# Patient Record
Sex: Female | Born: 1960 | Race: Black or African American | Hispanic: No | Smoking: Current every day smoker
Health system: Southern US, Community
[De-identification: ages and names within clinical notes are randomized; demographics above are authoritative.]

## PROBLEM LIST (undated history)

## (undated) DIAGNOSIS — T7840XA Allergy, unspecified, initial encounter: Secondary | ICD-10-CM

## (undated) HISTORY — DX: Allergy, unspecified, initial encounter: T78.40XA

## (undated) HISTORY — PX: ABDOMINAL HYSTERECTOMY: SHX81

---

## 2001-01-19 ENCOUNTER — Encounter: Payer: Self-pay | Admitting: Obstetrics and Gynecology

## 2001-01-19 ENCOUNTER — Ambulatory Visit (HOSPITAL_COMMUNITY): Admission: RE | Admit: 2001-01-19 | Discharge: 2001-01-19 | Payer: Self-pay | Admitting: Obstetrics and Gynecology

## 2001-01-31 ENCOUNTER — Encounter: Payer: Self-pay | Admitting: Emergency Medicine

## 2001-01-31 ENCOUNTER — Emergency Department (HOSPITAL_COMMUNITY): Admission: EM | Admit: 2001-01-31 | Discharge: 2001-01-31 | Payer: Self-pay | Admitting: Emergency Medicine

## 2001-06-22 ENCOUNTER — Ambulatory Visit (HOSPITAL_COMMUNITY): Admission: RE | Admit: 2001-06-22 | Discharge: 2001-06-22 | Payer: Self-pay | Admitting: *Deleted

## 2001-06-22 ENCOUNTER — Encounter: Payer: Self-pay | Admitting: *Deleted

## 2002-11-06 ENCOUNTER — Ambulatory Visit (HOSPITAL_COMMUNITY): Admission: RE | Admit: 2002-11-06 | Discharge: 2002-11-06 | Payer: Self-pay | Admitting: *Deleted

## 2002-11-06 ENCOUNTER — Encounter: Payer: Self-pay | Admitting: *Deleted

## 2003-06-25 ENCOUNTER — Emergency Department (HOSPITAL_COMMUNITY): Admission: EM | Admit: 2003-06-25 | Discharge: 2003-06-25 | Payer: Self-pay | Admitting: *Deleted

## 2003-07-08 ENCOUNTER — Emergency Department (HOSPITAL_COMMUNITY): Admission: EM | Admit: 2003-07-08 | Discharge: 2003-07-08 | Payer: Self-pay | Admitting: Emergency Medicine

## 2005-06-29 ENCOUNTER — Ambulatory Visit (HOSPITAL_COMMUNITY): Admission: RE | Admit: 2005-06-29 | Discharge: 2005-06-29 | Payer: Self-pay | Admitting: Family Medicine

## 2005-11-29 ENCOUNTER — Emergency Department (HOSPITAL_COMMUNITY): Admission: EM | Admit: 2005-11-29 | Discharge: 2005-11-29 | Payer: Self-pay | Admitting: Emergency Medicine

## 2006-12-28 ENCOUNTER — Ambulatory Visit (HOSPITAL_COMMUNITY): Admission: RE | Admit: 2006-12-28 | Discharge: 2006-12-28 | Payer: Self-pay | Admitting: Internal Medicine

## 2008-04-15 ENCOUNTER — Emergency Department (HOSPITAL_COMMUNITY): Admission: EM | Admit: 2008-04-15 | Discharge: 2008-04-15 | Payer: Self-pay | Admitting: Emergency Medicine

## 2008-06-06 ENCOUNTER — Emergency Department (HOSPITAL_COMMUNITY): Admission: EM | Admit: 2008-06-06 | Discharge: 2008-06-06 | Payer: Self-pay | Admitting: Emergency Medicine

## 2009-07-13 ENCOUNTER — Ambulatory Visit (HOSPITAL_COMMUNITY): Admission: RE | Admit: 2009-07-13 | Discharge: 2009-07-13 | Payer: Self-pay | Admitting: Family Medicine

## 2009-08-20 ENCOUNTER — Other Ambulatory Visit: Admission: RE | Admit: 2009-08-20 | Discharge: 2009-08-20 | Payer: Self-pay | Admitting: Obstetrics and Gynecology

## 2009-09-08 ENCOUNTER — Encounter: Payer: Self-pay | Admitting: Obstetrics and Gynecology

## 2009-09-08 ENCOUNTER — Inpatient Hospital Stay (HOSPITAL_COMMUNITY): Admission: RE | Admit: 2009-09-08 | Discharge: 2009-09-11 | Payer: Self-pay | Admitting: Obstetrics and Gynecology

## 2010-04-26 ENCOUNTER — Emergency Department (HOSPITAL_COMMUNITY)
Admission: EM | Admit: 2010-04-26 | Discharge: 2010-04-26 | Payer: Self-pay | Source: Home / Self Care | Admitting: Emergency Medicine

## 2010-07-19 LAB — URINALYSIS, ROUTINE W REFLEX MICROSCOPIC
Glucose, UA: NEGATIVE mg/dL
Hgb urine dipstick: NEGATIVE
Protein, ur: NEGATIVE mg/dL
Urobilinogen, UA: 0.2 mg/dL (ref 0.0–1.0)

## 2010-07-19 LAB — DIFFERENTIAL
Basophils Relative: 1 % (ref 0–1)
Lymphocytes Relative: 38 % (ref 12–46)
Lymphs Abs: 2.6 10*3/uL (ref 0.7–4.0)
Monocytes Relative: 7 % (ref 3–12)
Neutrophils Relative %: 53 % (ref 43–77)

## 2010-07-19 LAB — POCT CARDIAC MARKERS
CKMB, poc: <1 ng/mL — ABNORMAL LOW (ref 1.0–8.0)
Troponin i, poc: <0.05 ng/mL (ref 0.00–0.09)

## 2010-07-19 LAB — BASIC METABOLIC PANEL
BUN: 8 mg/dL (ref 6–23)
CO2: 26 mEq/L (ref 19–32)
GFR calc non Af Amer: >60 mL/min (ref >60)
Glucose, Bld: 88 mg/dL (ref 70–99)

## 2010-07-19 LAB — CBC
MCV: 91.7 fL (ref 78.0–100.0)
Platelets: 216 10*3/uL (ref 150–400)
WBC: 6.8 10*3/uL (ref 4.0–10.5)

## 2010-07-27 LAB — CBC
HCT: 31.6 % — ABNORMAL LOW (ref 36.0–46.0)
HCT: 35.7 % — ABNORMAL LOW (ref 36.0–46.0)
Hemoglobin: 10.6 g/dL — ABNORMAL LOW (ref 12.0–15.0)
Hemoglobin: 12.3 g/dL (ref 12.0–15.0)
MCHC: 33.5 g/dL (ref 30.0–36.0)
MCHC: 34.6 g/dL (ref 30.0–36.0)
MCV: 94.7 fL (ref 78.0–100.0)
Platelets: 174 10*3/uL (ref 150–400)
Platelets: 205 10*3/uL (ref 150–400)
RBC: 3.3 MIL/uL — ABNORMAL LOW (ref 3.87–5.11)
RBC: 3.77 MIL/uL — ABNORMAL LOW (ref 3.87–5.11)
RDW: 14 % (ref 11.5–15.5)
WBC: 8.2 10*3/uL (ref 4.0–10.5)

## 2010-07-27 LAB — COMPREHENSIVE METABOLIC PANEL
ALT: 11 U/L (ref 0–35)
AST: 19 U/L (ref 0–37)
Albumin: 3.8 g/dL (ref 3.5–5.2)
Alkaline Phosphatase: 41 U/L (ref 39–117)
Chloride: 106 mEq/L (ref 96–112)
GFR calc Af Amer: >60 mL/min (ref >60)
Sodium: 138 mEq/L (ref 135–145)
Total Bilirubin: 0.8 mg/dL (ref 0.3–1.2)

## 2010-07-27 LAB — BASIC METABOLIC PANEL
Creatinine, Ser: 0.86 mg/dL (ref 0.4–1.2)
GFR calc Af Amer: >60 mL/min (ref >60)
GFR calc non Af Amer: >60 mL/min (ref >60)
Glucose, Bld: 82 mg/dL (ref 70–99)

## 2010-07-27 LAB — DIFFERENTIAL
Basophils Relative: 0 % (ref 0–1)
Eosinophils Absolute: 0.1 10*3/uL (ref 0.0–0.7)
Eosinophils Relative: 1 % (ref 0–5)
Lymphocytes Relative: 18 % (ref 12–46)
Lymphs Abs: 1.4 10*3/uL (ref 0.7–4.0)
Monocytes Absolute: 0.5 10*3/uL (ref 0.1–1.0)
Monocytes Absolute: 0.5 10*3/uL (ref 0.1–1.0)
Monocytes Relative: 6 % (ref 3–12)
Neutro Abs: 6 10*3/uL (ref 1.7–7.7)
Neutrophils Relative %: 67 % (ref 43–77)
Neutrophils Relative %: 73 % (ref 43–77)

## 2010-07-27 LAB — HCG, QUANTITATIVE, PREGNANCY: hCG, Beta Chain, Quant, S: <2 m[IU]/mL (ref <5)

## 2012-08-06 ENCOUNTER — Emergency Department (HOSPITAL_COMMUNITY)
Admission: EM | Admit: 2012-08-06 | Discharge: 2012-08-06 | Disposition: A | Discharge status: Home / Self Care | Payer: BC Managed Care – PPO | Attending: Emergency Medicine | Admitting: Emergency Medicine

## 2012-08-06 ENCOUNTER — Encounter (HOSPITAL_COMMUNITY): Payer: Self-pay | Admitting: *Deleted

## 2012-08-06 ENCOUNTER — Emergency Department (HOSPITAL_COMMUNITY): Payer: BC Managed Care – PPO

## 2012-08-06 DIAGNOSIS — F17200 Nicotine dependence, unspecified, uncomplicated: Secondary | ICD-10-CM | POA: Insufficient documentation

## 2012-08-06 DIAGNOSIS — M545 Low back pain, unspecified: Secondary | ICD-10-CM | POA: Insufficient documentation

## 2012-08-06 DIAGNOSIS — M549 Dorsalgia, unspecified: Secondary | ICD-10-CM

## 2012-08-06 LAB — BASIC METABOLIC PANEL
CO2: 25 mEq/L (ref 19–32)
Chloride: 102 mEq/L (ref 96–112)
GFR calc Af Amer: >90 mL/min (ref >90)
Potassium: 3.7 mEq/L (ref 3.5–5.1)
Sodium: 137 mEq/L (ref 135–145)

## 2012-08-06 LAB — URINALYSIS, ROUTINE W REFLEX MICROSCOPIC
Glucose, UA: NEGATIVE mg/dL
Leukocytes, UA: NEGATIVE
Nitrite: NEGATIVE
Specific Gravity, Urine: >1.03 — ABNORMAL HIGH (ref 1.005–1.030)
pH: 5.5 (ref 5.0–8.0)

## 2012-08-06 LAB — CBC WITH DIFFERENTIAL/PLATELET
Basophils Absolute: 0 10*3/uL (ref 0.0–0.1)
Basophils Relative: 0 % (ref 0–1)
Eosinophils Absolute: 0.2 10*3/uL (ref 0.0–0.7)
MCH: 30.8 pg (ref 26.0–34.0)
MCV: 92.2 fL (ref 78.0–100.0)
Monocytes Relative: 6 % (ref 3–12)
Neutro Abs: 6.3 10*3/uL (ref 1.7–7.7)
Neutrophils Relative %: 64 % (ref 43–77)
RDW: 13.4 % (ref 11.5–15.5)

## 2012-08-06 MED ORDER — CYCLOBENZAPRINE HCL 10 MG PO TABS: 10.0000 mg | ORAL_TABLET | Freq: Three times a day (TID) | ORAL | Status: DC | PRN

## 2012-08-06 MED ORDER — OXYCODONE-ACETAMINOPHEN 5-325 MG PO TABS: 1.0000 | ORAL_TABLET | ORAL | Status: DC | PRN

## 2012-08-06 MED ORDER — MORPHINE SULFATE 2 MG/ML IJ SOLN
6.0000 mg | Freq: Once | INTRAMUSCULAR | Status: AC
  Administered 2012-08-06: 6 mg via INTRAMUSCULAR
  Filled 2012-08-06: qty 3

## 2012-08-06 MED ORDER — ONDANSETRON 8 MG PO TBDP
8.0000 mg | ORAL_TABLET | Freq: Once | ORAL | Status: AC
  Administered 2012-08-06: 8 mg via ORAL
  Filled 2012-08-06: qty 1

## 2012-08-06 MED ORDER — PREDNISONE 10 MG PO TABS: ORAL_TABLET | ORAL | Status: DC

## 2012-08-06 NOTE — ED Provider Notes (Signed)
History     CSN: [id number]  Arrival date & time 08/06/12  1331   First MD Initiated Contact with Patient 08/06/12 1605      Chief Complaint  Patient presents with  . Back Pain    (Consider location/radiation/quality/duration/timing/severity/associated sxs/prior treatment) HPI Comments: Patient c/o increasing low back pain for 5 days.  Describes the pain as "sharp and aching" across her lower back.  She states that she has been coughing and having chest congestion for 2 weeks prior to onset of the back pain.  She has recently completed a course of Augmentin and zithromax.    Patient is a 52 y.o. female presenting with back pain. The history is provided by the patient.  Back Pain Location:  Lumbar spine Quality:  Aching Radiates to:  Does not radiate Pain severity:  Severe Pain is:  Same all the time Onset quality:  Gradual Duration:  5 days Timing:  Constant Progression:  Worsening Chronicity:  New Context: not falling, not jumping from heights, not lifting heavy objects, not MVA, not recent injury and not twisting   Context comment:  No known injury Relieved by:  Nothing Worsened by:  Ambulation, bending, standing, movement and twisting Ineffective treatments:  None tried Associated symptoms: no abdominal pain, no abdominal swelling, no bladder incontinence, no bowel incontinence, no chest pain, no dysuria, no fever, no leg pain, no numbness, no paresthesias, no pelvic pain, no perianal numbness, no tingling and no weakness     History reviewed. No pertinent past medical history.  Past Surgical History  Procedure Laterality Date  . Abdominal hysterectomy      History reviewed. No pertinent family history.  History  Substance Use Topics  . Smoking status: Current Every Day Smoker  . Smokeless tobacco: Not on file  . Alcohol Use: Yes    OB History   Grav Para Term Preterm Abortions TAB SAB Ect Mult Living                  Review of Systems  Constitutional:  Negative for fever.  Respiratory: Negative for shortness of breath.   Cardiovascular: Negative for chest pain.  Gastrointestinal: Negative for vomiting, abdominal pain, constipation and bowel incontinence.  Genitourinary: Negative for bladder incontinence, dysuria, hematuria, flank pain, decreased urine volume, difficulty urinating and pelvic pain.       No perineal numbness or incontinence of urine or feces  Musculoskeletal: Positive for back pain. Negative for joint swelling.  Skin: Negative for rash.  Neurological: Negative for tingling, weakness, numbness and paresthesias.  All other systems reviewed and are negative.    Allergies  Dilaudid and Hydrocodone  Home Medications   Current Outpatient Rx  Name  Route  Sig  Dispense  Refill  . ibuprofen (ADVIL,MOTRIN) 200 MG tablet   Oral   Take 400 mg by mouth every 6 (six) hours as needed for pain.         Marland Kitchen amoxicillin-clavulanate (AUGMENTIN) 875-125 MG per tablet   Oral   Take 1 tablet by mouth 2 (two) times daily.         Marland Kitchen azithromycin (ZITHROMAX) 250 MG tablet   Oral   Take 250 mg by mouth daily.           BP 127/82  Pulse 84  Temp(Src) 98.5 F (36.9 C)  Resp 20  Ht 5\' 7"  (1.702 m)  Wt 169 lb (76.658 kg)  BMI 26.46 kg/m2  SpO2 100%  Physical Exam  Nursing note and vitals  reviewed. Constitutional: She is oriented to person, place, and time. She appears well-developed and well-nourished. No distress.  HENT:  Head: Normocephalic and atraumatic.  Mouth/Throat: Oropharynx is clear and moist.  Neck: Normal range of motion. Neck supple.  Cardiovascular: Normal rate, regular rhythm, normal heart sounds and intact distal pulses.   No murmur heard. Pulmonary/Chest: Effort normal and breath sounds normal. No respiratory distress. She has no wheezes. She has no rales. She exhibits no tenderness.  Abdominal: Soft. She exhibits no distension. There is no tenderness. There is no rebound and no guarding.    Musculoskeletal: She exhibits tenderness. She exhibits no edema.       Lumbar back: She exhibits tenderness and pain. She exhibits normal range of motion, no swelling, no deformity, no laceration and normal pulse.       Back:  ttp of the lumbar spine and paraspinal muscles.  DP pulses brisk and symmetrical, distal sensation intact  Neurological: She is alert and oriented to person, place, and time. No cranial nerve deficit or sensory deficit. She exhibits normal muscle tone. Coordination and gait normal.  Reflex Scores:      Patellar reflexes are 2+ on the right side and 2+ on the left side.      Achilles reflexes are 2+ on the right side and 2+ on the left side. Skin: Skin is warm and dry.    ED Course  Procedures (including critical care time)  Results for orders placed during the hospital encounter of 08/06/12  CBC WITH DIFFERENTIAL      Result Value Range   WBC 9.9  4.0 - 10.5 K/uL   RBC 4.09  3.87 - 5.11 MIL/uL   Hemoglobin 12.6  12.0 - 15.0 g/dL   HCT 16.1  09.6 - 04.5 %   MCV 92.2  78.0 - 100.0 fL   MCH 30.8  26.0 - 34.0 pg   MCHC 33.4  30.0 - 36.0 g/dL   RDW 40.9  81.1 - 91.4 %   Platelets 229  150 - 400 K/uL   Neutrophils Relative 64  43 - 77 %   Neutro Abs 6.3  1.7 - 7.7 K/uL   Lymphocytes Relative 29  12 - 46 %   Lymphs Abs 2.9  0.7 - 4.0 K/uL   Monocytes Relative 6  3 - 12 %   Monocytes Absolute 0.6  0.1 - 1.0 K/uL   Eosinophils Relative 2  0 - 5 %   Eosinophils Absolute 0.2  0.0 - 0.7 K/uL   Basophils Relative 0  0 - 1 %   Basophils Absolute 0.0  0.0 - 0.1 K/uL  BASIC METABOLIC PANEL      Result Value Range   Sodium 137  135 - 145 mEq/L   Potassium 3.7  3.5 - 5.1 mEq/L   Chloride 102  96 - 112 mEq/L   CO2 25  19 - 32 mEq/L   Glucose, Bld 80  70 - 99 mg/dL   BUN 11  6 - 23 mg/dL   Creatinine, Ser 7.82  0.50 - 1.10 mg/dL   Calcium 9.2  8.4 - 95.6 mg/dL   GFR calc non Af Amer >90  >90 mL/min   GFR calc Af Amer >90  >90 mL/min  URINALYSIS, ROUTINE W REFLEX  MICROSCOPIC      Result Value Range   Color, Urine YELLOW  YELLOW   APPearance CLEAR  CLEAR   Specific Gravity, Urine >1.030 (*) 1.005 - 1.030   pH 5.5  5.0 - 8.0   Glucose, UA NEGATIVE  NEGATIVE mg/dL   Hgb urine dipstick NEGATIVE  NEGATIVE   Bilirubin Urine NEGATIVE  NEGATIVE   Ketones, ur NEGATIVE  NEGATIVE mg/dL   Protein, ur NEGATIVE  NEGATIVE mg/dL   Urobilinogen, UA 0.2  0.0 - 1.0 mg/dL   Nitrite NEGATIVE  NEGATIVE   Leukocytes, UA NEGATIVE  NEGATIVE     Dg Lumbar Spine Complete  08/06/2012  *RADIOLOGY REPORT*  Clinical Data: Back pain  LUMBAR SPINE - COMPLETE 4+ VIEW  Comparison: None.  Findings: Negative for fracture.  Normal alignment.  No pars defect.  SI joints are intact.  IMPRESSION: Negative   Original Report Authenticated By: Janeece Riggers, M.D.       MDM   Patient has ttp of the lumbar spine and paraspinal muscles.  No focal neuro deficits on exam. Discussed results with patient.   Ambulates with a slow, but steady gait.   No fever, doubt emergent infectious or neurological process.    She is feeling better after IM Morphine and ODT zofran.  She agrees to close f/u with her PMD.  I have also advised her to return here if her sx's worsen.  The patient appears reasonably screened and/or stabilized for discharge and I doubt any other medical condition or other [facility] requiring further screening, evaluation, or treatment in the ED at this time prior to discharge.       Kathlene Yano L. Trisha Mangle, PA-C 08/09/12 1347

## 2012-08-06 NOTE — ED Notes (Signed)
Pa triplett to see pt.

## 2012-08-06 NOTE — ED Notes (Signed)
Low back pain since Wednesday, No injury,INcreased pain with movement Recent URI , treated at [facility] Medicine.  Cough,green, brown sputum.  No NVD.  No fever.

## 2012-08-06 NOTE — ED Notes (Signed)
Pt stated she has been sick for several weeks w/ sinus infection and cough. She has been on antibiotics and "do not seem to be getting any better", she has been having severe low back pain for the last 5 days, denies any known injury to her back. Denies any fever or urinary s/s.  Tearful in exam room and stated she has been unable to work. Which is un-like her.

## 2012-08-06 NOTE — ED Notes (Signed)
Pt sitting up in chair, stated she is feeling much better, sitting up in chair currently. Friend at bedside.  Pt was given ginger ale and crackers. Tolerated well.

## 2012-08-09 NOTE — ED Provider Notes (Signed)
Medical screening examination/treatment/procedure(s) were performed by non-physician practitioner and as supervising physician I was immediately available for consultation/collaboration.   Laray Anger, DO 08/09/12 2040

## 2013-03-11 ENCOUNTER — Emergency Department (HOSPITAL_COMMUNITY): Payer: BC Managed Care – PPO

## 2013-03-11 ENCOUNTER — Emergency Department (HOSPITAL_COMMUNITY)
Admission: EM | Admit: 2013-03-11 | Discharge: 2013-03-11 | Disposition: A | Discharge status: Home / Self Care | Payer: BC Managed Care – PPO | Attending: Emergency Medicine | Admitting: Emergency Medicine

## 2013-03-11 ENCOUNTER — Encounter (HOSPITAL_COMMUNITY): Payer: Self-pay | Admitting: Emergency Medicine

## 2013-03-11 DIAGNOSIS — R1031 Right lower quadrant pain: Secondary | ICD-10-CM | POA: Insufficient documentation

## 2013-03-11 DIAGNOSIS — Z792 Long term (current) use of antibiotics: Secondary | ICD-10-CM | POA: Insufficient documentation

## 2013-03-11 DIAGNOSIS — R1909 Other intra-abdominal and pelvic swelling, mass and lump: Secondary | ICD-10-CM | POA: Insufficient documentation

## 2013-03-11 DIAGNOSIS — F17200 Nicotine dependence, unspecified, uncomplicated: Secondary | ICD-10-CM | POA: Insufficient documentation

## 2013-03-11 DIAGNOSIS — R1900 Intra-abdominal and pelvic swelling, mass and lump, unspecified site: Secondary | ICD-10-CM

## 2013-03-11 DIAGNOSIS — Z79899 Other long term (current) drug therapy: Secondary | ICD-10-CM | POA: Insufficient documentation

## 2013-03-11 LAB — URINE MICROSCOPIC-ADD ON

## 2013-03-11 LAB — CBC WITH DIFFERENTIAL/PLATELET
Basophils Absolute: 0 10*3/uL (ref 0.0–0.1)
Basophils Relative: 0 % (ref 0–1)
HCT: 37.5 % (ref 36.0–46.0)
Hemoglobin: 12.7 g/dL (ref 12.0–15.0)
Lymphocytes Relative: 13 % (ref 12–46)
Monocytes Absolute: 0.7 10*3/uL (ref 0.1–1.0)
Monocytes Relative: 6 % (ref 3–12)
Neutro Abs: 8.6 10*3/uL — ABNORMAL HIGH (ref 1.7–7.7)
Neutrophils Relative %: 80 % — ABNORMAL HIGH (ref 43–77)
WBC: 10.8 10*3/uL — ABNORMAL HIGH (ref 4.0–10.5)

## 2013-03-11 LAB — BASIC METABOLIC PANEL
BUN: 10 mg/dL (ref 6–23)
CO2: 25 mEq/L (ref 19–32)
Chloride: 98 mEq/L (ref 96–112)
Creatinine, Ser: 0.87 mg/dL (ref 0.50–1.10)
GFR calc Af Amer: 89 mL/min — ABNORMAL LOW (ref >90)
Potassium: 3.5 mEq/L (ref 3.5–5.1)

## 2013-03-11 LAB — URINALYSIS, ROUTINE W REFLEX MICROSCOPIC
Glucose, UA: NEGATIVE mg/dL
Ketones, ur: 40 mg/dL — AB
Leukocytes, UA: NEGATIVE
Nitrite: NEGATIVE
Specific Gravity, Urine: >1.03 — ABNORMAL HIGH (ref 1.005–1.030)
pH: 6 (ref 5.0–8.0)

## 2013-03-11 MED ORDER — IOHEXOL 300 MG/ML  SOLN
50.0000 mL | Freq: Once | INTRAMUSCULAR | Status: AC | PRN
  Administered 2013-03-11: 50 mL via ORAL

## 2013-03-11 MED ORDER — POLYETHYLENE GLYCOL 3350 17 G PO PACK: 17.0000 g | PACK | Freq: Every day | ORAL | Status: DC

## 2013-03-11 MED ORDER — OXYCODONE-ACETAMINOPHEN 5-325 MG PO TABS
1.0000 | ORAL_TABLET | ORAL | Status: DC | PRN
Start: 2013-03-11 — End: 2015-11-09

## 2013-03-11 MED ORDER — MORPHINE SULFATE 4 MG/ML IJ SOLN
4.0000 mg | Freq: Once | INTRAMUSCULAR | Status: AC
  Administered 2013-03-11: 4 mg via INTRAVENOUS
  Filled 2013-03-11: qty 1

## 2013-03-11 MED ORDER — IOHEXOL 300 MG/ML  SOLN
100.0000 mL | Freq: Once | INTRAMUSCULAR | Status: AC | PRN
  Administered 2013-03-11: 100 mL via INTRAVENOUS

## 2013-03-11 MED ORDER — ONDANSETRON HCL 4 MG/2ML IJ SOLN
4.0000 mg | Freq: Once | INTRAMUSCULAR | Status: AC
  Administered 2013-03-11: 4 mg via INTRAVENOUS
  Filled 2013-03-11: qty 2

## 2013-03-11 NOTE — ED Provider Notes (Signed)
CSN: [id number]     Arrival date & time 03/11/13  1338 History   First MD Initiated Contact with Patient 03/11/13 1351     Chief Complaint  Patient presents with  . Flank Pain   (Consider location/radiation/quality/duration/timing/severity/associated sxs/prior Treatment) HPI Comments: Patient presents to the ER with complaints of right lower abdominal and pelvic pain for 3 days. Patient reports that the pain has been constant and severe since it started. She has not identified any factors that make it better or worse. She has not had any fever, nausea, vomiting or diarrhea. Patient says that at times it is a crampy, like when she has had severe cast in the past. He does not feel like when she had a kidney stone previously. She has not had any urinary symptoms.  Patient is a 52 y.o. female presenting with flank pain.  Flank Pain Associated symptoms include abdominal pain.    History reviewed. No pertinent past medical history. Past Surgical History  Procedure Laterality Date  . Abdominal hysterectomy     No family history on file. History  Substance Use Topics  . Smoking status: Current Every Day Smoker  . Smokeless tobacco: Not on file  . Alcohol Use: Yes   OB History   Grav Para Term Preterm Abortions TAB SAB Ect Mult Living                 Review of Systems  Gastrointestinal: Positive for abdominal pain.  Genitourinary: Positive for pelvic pain. Negative for dysuria, frequency and hematuria.  All other systems reviewed and are negative.    Allergies  Dilaudid and Hydrocodone  Home Medications   Current Outpatient Rx  Name  Route  Sig  Dispense  Refill  . amoxicillin-clavulanate (AUGMENTIN) 875-125 MG per tablet   Oral   Take 1 tablet by mouth 2 (two) times daily.         Marland Kitchen azithromycin (ZITHROMAX) 250 MG tablet   Oral   Take 250 mg by mouth daily.         . cyclobenzaprine (FLEXERIL) 10 MG tablet   Oral   Take 1 tablet (10 mg total) by mouth 3 (three)  times daily as needed.   21 tablet   0   . ibuprofen (ADVIL,MOTRIN) 200 MG tablet   Oral   Take 400 mg by mouth every 6 (six) hours as needed for pain.         Marland Kitchen oxyCODONE-acetaminophen (PERCOCET/ROXICET) 5-325 MG per tablet   Oral   Take 1 tablet by mouth every 4 (four) hours as needed for pain.   20 tablet   0   . predniSONE (DELTASONE) 10 MG tablet      Take 6 tablets day one, 5 tablets day two, 4 tablets day three, 3 tablets day four, 2 tablets day five, then 1 tablet day six   21 tablet   0    BP 127/85  Pulse 93  Temp(Src) 98.8 F (37.1 C) (Oral)  Resp 22  Ht 5\' 7"  (1.702 m)  Wt 161 lb (73.029 kg)  BMI 25.21 kg/m2  SpO2 100% Physical Exam  Constitutional: She is oriented to person, place, and time. She appears well-developed and well-nourished. No distress.  HENT:  Head: Normocephalic and atraumatic.  Right Ear: Hearing normal.  Left Ear: Hearing normal.  Nose: Nose normal.  Mouth/Throat: Oropharynx is clear and moist and mucous membranes are normal.  Eyes: Conjunctivae and EOM are normal. Pupils are equal, round, and reactive to  light.  Neck: Normal range of motion. Neck supple.  Cardiovascular: Regular rhythm, S1 normal and S2 normal.  Exam reveals no gallop and no friction rub.   No murmur heard. Pulmonary/Chest: Effort normal and breath sounds normal. No respiratory distress. She exhibits no tenderness.  Abdominal: Soft. Normal appearance and bowel sounds are normal. There is no hepatosplenomegaly. There is tenderness in the right lower quadrant. There is no rebound, no guarding, no tenderness at McBurney's point and negative Murphy's sign. No hernia.  Musculoskeletal: Normal range of motion.  Neurological: She is alert and oriented to person, place, and time. She has normal strength. No cranial nerve deficit or sensory deficit. Coordination normal. GCS eye subscore is 4. GCS verbal subscore is 5. GCS motor subscore is 6.  Skin: Skin is warm, dry and intact.  No rash noted. No cyanosis.  Psychiatric: She has a normal mood and affect. Her speech is normal and behavior is normal. Thought content normal.    ED Course  Procedures (including critical care time) Labs Review Labs Reviewed  CBC WITH DIFFERENTIAL - Abnormal; Notable for the following:    WBC 10.8 (*)    Neutrophils Relative % 80 (*)    Neutro Abs 8.6 (*)    All other components within normal limits  BASIC METABOLIC PANEL - Abnormal; Notable for the following:    GFR calc non Af Amer 76 (*)    GFR calc Af Amer 89 (*)    All other components within normal limits  URINALYSIS, ROUTINE W REFLEX MICROSCOPIC - Abnormal; Notable for the following:    Specific Gravity, Urine >1.030 (*)    Hgb urine dipstick TRACE (*)    Bilirubin Urine SMALL (*)    Ketones, ur 40 (*)    All other components within normal limits  URINE MICROSCOPIC-ADD ON   Imaging Review Ct Abdomen Pelvis W Contrast  03/11/2013   CLINICAL DATA:  Initial encounter for increasing low back pain, right lower quadrant abdominal pain, and pelvic pain over the past 5 days. Patient recently completed a course of antibiotic therapy including Augmentin and Zithromax. Surgical history includes abdominal hysterectomy (supracervical hysterectomy, right salpingectomy, and excision of abdominal wall lipoma) per operative report 09/08/2009.  EXAM: CT ABDOMEN AND PELVIS WITH CONTRAST  TECHNIQUE: Multidetector CT imaging of the abdomen and pelvis was performed using the standard protocol following bolus administration of intravenous contrast.  CONTRAST:  OMNIPAQUE IOHEXOL 300 MG/ML IV. Oral contrast also administered.  COMPARISON:  06/22/2001.  FINDINGS: Soft tissue mass involving the midline low pelvis just above the vaginal fornices measuring approximately 4.0 x 7.8 x 3.3 cm (series 2, image 66 and coronal image 56). Heterogeneous masses in both sides of the mid pelvis, that on the right measuring approximately 6.8 x 4.6 x 5.2 cm (series  2, image 59 and coronal image 50), and that on the left measuring approximately 7.2 x 3.5 x 6.9 cm (series 2, image 59 coronal image 47). The mid portion of the sigmoid colon is compressed between these 2 masses, though the colon is not distended to suggest obstruction; however, there is a large stool burden throughout the colon.  Upper normal size liver with a prominent Reidel lobe, but no focal hepatic parenchymal abnormality. Normal appearing spleen, pancreas, adrenal glands, left kidney, and gallbladder. 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 Normal appearing gallbladder with no evidence of biliary ductal dilation. Mild distal abdominal aortic and bilateral common iliac artery  atherosclerosis without aneurysm.  Numerous upper normal sized lymph nodes throughout the retroperitoneum, the largest a left common iliac node measuring approximately 1.1 x 1.3 cm (series 2, image 40). Enlarged gastrohepatic ligament lymph node approximating 2.0 x 1.5 cm (series 2, image 16).  Normal appearing stomach and small bowel. Cecum extends into the low pelvis, and a normal appearing appendix is identified anteriorly in the right low pelvis. No ascites.  Urinary bladder decompressed and unremarkable. Numerous very large pelvic phleboliths, with prominent pelvic veins bilaterally.  Bone window images unremarkable apart from mild facet degenerative changes on the right at L4-5 and L5-S1, and broad-based central disc protrusion at L4-5. Visualized lung bases clear. Heart size normal.  IMPRESSION: 1. Soft tissue mass in the midline low pelvis just above the vaginal fornices, measured above. With prior history of supracervical hysterectomy, cancer involving the cervix should be excluded. 2. Heterogeneous masses in both sides of the mid pelvis, measured above. 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  3. Numerous upper normal sized lymph nodes throughout the retroperitoneum and a borderline enlarged lymph node in the gastrohepatic ligament. No nodal masses elsewhere. 4. No evidence of metastatic disease elsewhere in the abdomen or pelvis. 5. Mid sigmoid colon narrowed between the 2 pelvic masses, but no evidence of colonic obstruction currently. Large stool burden throughout the colon.   Electronically Signed   By: Hulan Saas M.D.   On: 03/11/2013 15:17    EKG Interpretation   None       MDM  Diagnosis: Pelvic Mass; Abdominal and Pelvic Pain  Patient presents to the ER with complaints of pain in the low abdomen and pelvis region with radiation over to the right side. Symptoms present for several days. She reports that initially it felt like gas, but symptoms have not worsened in pain is continuous. Examination of the is tenderness in the suprapubic and lower abdominal area without focality. No signs of peritonitis. Gross moderately unremarkable. CAT scan was performed to further evaluate, including appendix. Patient has soft tissue mass in the midline which is concerning for possible cervical cancer. Additionally she has bilateral masses in the ovaries which could be metastasis.  These findings were discussed with the patient. I did inform her that there was a mass that could in fact be cancer. Her initial hysterectomy was performed by Doctor Emelda Fear. She will be referred back to Doctor Emelda Fear for further evaluation. Patient will be prescribed analgesia. She does have evidence of constipation and increased Berner CAT scan, will be prescribed laxative in addition to analgesia. Patient expresses understanding of the findings of the CAT scan and will followup with Doctor Emelda Fear.    Gilda Crease, MD 03/11/13 [phone]

## 2013-03-11 NOTE — ED Notes (Signed)
Complain of pain in right side since Friday. States she was nauseated on Friday

## 2013-03-13 ENCOUNTER — Encounter: Payer: Self-pay | Admitting: Obstetrics and Gynecology

## 2013-03-13 ENCOUNTER — Telehealth: Payer: Self-pay | Admitting: Obstetrics and Gynecology

## 2013-03-13 DIAGNOSIS — R1900 Intra-abdominal and pelvic swelling, mass and lump, unspecified site: Secondary | ICD-10-CM | POA: Insufficient documentation

## 2013-03-13 NOTE — Telephone Encounter (Signed)
ED visit 11/3 indicated "pelvic mass" in area of residual cervix after supracervical hysterectomy.  Left msg for pt to call office for followup appt.

## 2013-03-14 NOTE — Telephone Encounter (Signed)
Left message to call and schedule appt per Dr Emelda Fear request.

## 2013-03-18 ENCOUNTER — Ambulatory Visit (INDEPENDENT_AMBULATORY_CARE_PROVIDER_SITE_OTHER): Payer: BC Managed Care – PPO | Admitting: Adult Health

## 2013-03-18 ENCOUNTER — Other Ambulatory Visit (HOSPITAL_COMMUNITY)
Admission: RE | Admit: 2013-03-18 | Discharge: 2013-03-18 | Disposition: A | Discharge status: Home / Self Care | Payer: BC Managed Care – PPO | Source: Ambulatory Visit | Attending: Adult Health | Admitting: Adult Health

## 2013-03-18 ENCOUNTER — Encounter: Payer: Self-pay | Admitting: Adult Health

## 2013-03-18 VITALS — BP 120/80 | Ht 67.0 in | Wt 167.0 lb

## 2013-03-18 DIAGNOSIS — Z124 Encounter for screening for malignant neoplasm of cervix: Secondary | ICD-10-CM

## 2013-03-18 DIAGNOSIS — Z1151 Encounter for screening for human papillomavirus (HPV): Secondary | ICD-10-CM | POA: Insufficient documentation

## 2013-03-18 DIAGNOSIS — R1900 Intra-abdominal and pelvic swelling, mass and lump, unspecified site: Secondary | ICD-10-CM

## 2013-03-18 DIAGNOSIS — Z1212 Encounter for screening for malignant neoplasm of rectum: Secondary | ICD-10-CM

## 2013-03-18 DIAGNOSIS — Z01419 Encounter for gynecological examination (general) (routine) without abnormal findings: Secondary | ICD-10-CM | POA: Insufficient documentation

## 2013-03-18 DIAGNOSIS — Z113 Encounter for screening for infections with a predominantly sexual mode of transmission: Secondary | ICD-10-CM | POA: Insufficient documentation

## 2013-03-18 DIAGNOSIS — N949 Unspecified condition associated with female genital organs and menstrual cycle: Secondary | ICD-10-CM

## 2013-03-18 LAB — HEMOCCULT GUIAC POC 1CARD (OFFICE): Fecal Occult Blood, POC: NEGATIVE

## 2013-03-18 NOTE — Assessment & Plan Note (Signed)
Pap performed 11/10 with HPV and GC/CHL and will get CA 125 and pelvic US

## 2013-03-18 NOTE — Progress Notes (Addendum)
Subjective:     Patient ID: Kristen Lang, female   DOB: 08-Jun-1960, 52 y.o.   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  Review of Systems Positives in HPI Reviewed past medical,surgical, social and family history. Reviewed medications and allergies. Past Medical History  Diagnosis Date  . Allergy   Current outpatient prescriptions:oxyCODONE-acetaminophen (PERCOCET) 5-325 MG per tablet, Take 1-2 tablets by mouth every 4 (four) hours as needed for pain., Disp: 20 tablet, Rfl: 0;  polyethylene glycol (MIRALAX / GLYCOLAX) packet, Take 17 g by mouth daily., Disp: 5 each, Rfl: 0    Past Surgical History  Procedure Laterality Date  . Abdominal hysterectomy     Objective:   Physical Exam BP 120/80  Ht 5\' 7"  (1.702 m)  Wt 167 lb (75.751 kg)  BMI 26.15 kg/m2   Skin warm and dry. Neck: mid line trachea, normal thyroid. Lungs: clear to ausculation bilaterally. Cardiovascular: regular rate and rhythm.Abdomen soft, no HSM noted.Pelvic: external genitalia is normal in appearance, vagina: white discharge without odor, cervix:smooth and bulbous,Pap with HPV and GC/CHL performed. uterus: is absent,  Adnexa:+  Mass RLQ and  tenderness noted through out pelvis esp RLQ. On rectal exam no polyps or hemorrhoids and hemoccult negative. We discussed it could be a cancer or benign mass, that is the reason for the tests today  Assessment:    Pelvic pain  Pelvic mass with +lymph nodes    Plan:     [place] scheduled 11/11 at 3 pm at [facility] Check CA 125 Return in 2 days with Dr Emelda Fear to discuss results Note given to return  to work 03/25/13 Review handout on pelvic pain

## 2013-03-18 NOTE — Patient Instructions (Addendum)
Pelvic Mass A "mass" is a lump that either your caregiver found during an examination or you found before seeing your caregiver. The "pelvis" is the lower portion of the trunk in between the hip bones. There are many possible reasons why a lump has appeared. Testing will help determine the cause and the steps to a solution. CAUSES  Before complete testing is done, it may be difficult or impossible for your caregiver to know if the lump is truly in one of the pelvic organs (such as the uterus or ovaries) or is coming from one of many organs that are near the pelvis. Problems in the colon or kidney can also lead to a lump that might seem to be in the pelvis. If testing shows that the mass is in the pelvis, there are still many possible causes:  Tumors and cancers. These problems are relatively common and are the greatest source of worry for patients. Cancerous lumps in the pelvis may be due to cancers that started in the uterus or ovaries or due to cancers that started in other areas and then spread to the pelvis. Many cancers are very treatable when found early.  Non-cancerous tumors and masses. There are a large number of common and uncommon non-cancerous problems that can lead to a mass in the pelvis. Two very common ones are fibroids of the uterus and ovarian cysts. Before testing and/or surgery, it may be impossible to tell the difference between these problems and a cancer.  Infection. Certain types of infections can produce a mass in the pelvis. The infection might be caused by bacteria. If there is an infection treatment might include antibiotics. Masses from infection can also be caused by certain viruses, and in rare cases, by fungi or parasites. If infection is the cause, your caregiver will be able to determine the type of germ responsible for the mass by doing appropriate testing.  Inflammatory bowel disease. These are diseases thought to be caused by a defect in the immune system of the  intestine. There are two inflammatory bowel diseases: Ulcerative Colitis and Crohn's Disease. They are lifelong problems with symptoms that can come and go. Sometimes, patients with these diseases will develop a mass in the lower part of the colon that can make it seem as though there is a mass in the pelvis.  Past Surgery. If there has been pelvic surgery in the past, and there is a lot of scarring that forms during the process of healing, this can eventually fell like a mass when examined by your caregiver. As with the other problems described above, this may or may not be associated with symptoms or feeling badly.  Ectopic Pregnancy. This is a condition where the growing fetus is growing outside of the uterus. This is a common cause for a pelvic mass and may become a serious or life-threatening problem that requires immediate surgery. SYMPTOMS  In people with a pelvic mass, there may be a large variety of associated symptoms including:   No symptoms, other than the appearance of the mass itself.  Cramping, nausea, diarrhea.  Fever, vomiting, weakness.  Pelvic, side, and/or back pain.  Weight loss.  Constipation.  Problems with vaginal bleeding. This can be very variable. Bleeding might be very light or very heavy. Bleeding may be mixed with large clots. Menstruation may be very frequent and may seem to almost completely stop. There may be varying levels of pain with menstruation.  Urinary symptoms including frequent urination, inability to empty the  bladder completely, or urinating very small amounts. DIAGNOSIS  Because of the large number of causes of a mass in the pelvis, your caregiver will ask you to undergo testing in order to get a clear diagnosis in a timely manner. The tests might include some or all of the following:  Blood tests such as a blood count, measurement of common minerals in the blood, kidney/liver/pancreas function, pregnancy test, and others.  X-rays. Plain x-rays  and special x-rays may be requested except if you are pregnant.  Ultrasound. This is a test that uses sound waves to "paint a picture" of the mass. The type of "sound picture" that is seen can help to narrow the diagnosis.  CAT scan and MRI imaging. Each can provide additional information as to the different characteristics of the mass and can help to develop a final plan for diagnostics and treatment. If cancer is suspected, these special tests can also help to show any spread of the cancer to other parts of the body. It is possible that these tests may not be ordered if you are pregnant.  Laparoscopy. This is a special exam of the inside of the pelvic area using a slim, flexible, lighted tube. This allows your caregiver to get a direct look at the mass. Sometimes, this allows getting a very small piece of the mass (a biopsy). This piece of tissue can then be examined in a lab that will frequently lead to a clear diagnosis. In some cases, your caregiver can use a laparoscope to completely remove the mass after it has been examined.  Surgery. Sometimes, a diagnosis can only be made by carrying out an operation and obtaining a biopsy (as noted above). Many times, the biopsy is obtained and the mass is removed during the same operation. These are the most common ways for determining the exact cause of the mass. Your caregiver may recommend other tests that are not listed here. TREATMENT  Treatment(s) can only be recommended after a diagnosis is made. Your caregiver will discuss your test results with you, the meanings of the tests, and the recommended steps to begin treatment. He/she will also recommend whether you need to be examined by specialists as you go through the steps of diagnosis and a treatment plan is developed. HOME CARE INSTRUCTIONS   Test preparation. Carefully follow instructions when preparing for certain tests. This may involve many things such as:  Drinking fluids to fill the bladder  before a pelvic ultrasound.  Fasting before certain blood tests.  Drinking special "contrast" fluids that are necessary for obtaining the best CAT scan and MRI images.  Medications. Your caregiver may prescribe medications to help relieve symptoms while you undergo testing. It is important that your current medications (prescription, non-prescription, herbal, vitamins, etc.) be kept in mind when new prescriptions are recommended.  Diet. There may be a need for changes in diet in order to help with symptom relief while testing is being done. If this applies to you, your caregiver will discuss these changes with you. SEEK MEDICAL CARE IF:   You cannot hold down any of the recommended fluids used to prepare for tests such as CAT scan MRI.  You feel that you are having trouble with any new prescriptions.  You develop new symptoms of pain, vomiting, diarrhea, fever, or other problems that you did not feel since your last exam.  You experience inability to empty your bladder completely or develop painful and/or bloody urination. SEEK IMMEDIATE MEDICAL CARE IF:  You vomit bright red blood, or a coffee ground appearing material.  You have blood in your stools, or the stools turn black and tarry.  You have an abnormal or increased amount of vaginal bleeding.  You have a fever.  You develop easy bruising or bleeding.  You develop pain that is not controlled by your medication.  You feel worsening weakness or you have a fainting episode.  You feel that the mass has suddenly gotten larger.  You develop severe bloating in the abdomen and/or pelvis.  You cannot pass any urine. MAKE SURE YOU:   Understand these instructions.  Will watch your condition.  Will get help right away if you are not doing well or get worse. Document Released: 08/02/2006 Document Revised: 07/18/2011 Document Reviewed: 04/10/2007 [facility] Patient Information 2014 [place], [place]. Get [place] at [facility] 11/11 at 3 pm  with full bladder Return here in 2 days to see Dr Rachel Bo

## 2013-03-19 ENCOUNTER — Ambulatory Visit (HOSPITAL_COMMUNITY): Payer: BC Managed Care – PPO

## 2013-03-19 ENCOUNTER — Ambulatory Visit (HOSPITAL_COMMUNITY)
Admission: RE | Admit: 2013-03-19 | Discharge: 2013-03-19 | Disposition: A | Discharge status: Home / Self Care | Payer: BC Managed Care – PPO | Source: Ambulatory Visit | Attending: Adult Health | Admitting: Adult Health

## 2013-03-19 DIAGNOSIS — N949 Unspecified condition associated with female genital organs and menstrual cycle: Secondary | ICD-10-CM | POA: Insufficient documentation

## 2013-03-19 DIAGNOSIS — R1900 Intra-abdominal and pelvic swelling, mass and lump, unspecified site: Secondary | ICD-10-CM | POA: Insufficient documentation

## 2013-03-19 LAB — CA 125: CA 125: 6 U/mL (ref 0.0–30.2)

## 2013-03-20 ENCOUNTER — Ambulatory Visit (INDEPENDENT_AMBULATORY_CARE_PROVIDER_SITE_OTHER): Payer: BC Managed Care – PPO | Admitting: Obstetrics and Gynecology

## 2013-03-20 ENCOUNTER — Encounter: Payer: Self-pay | Admitting: Obstetrics and Gynecology

## 2013-03-20 VITALS — BP 140/86 | Ht 67.0 in | Wt 161.0 lb

## 2013-03-20 DIAGNOSIS — N83201 Unspecified ovarian cyst, right side: Secondary | ICD-10-CM

## 2013-03-20 DIAGNOSIS — N83209 Unspecified ovarian cyst, unspecified side: Secondary | ICD-10-CM

## 2013-03-20 NOTE — Patient Instructions (Signed)
Recheck in 3 month will complete eval.

## 2013-03-20 NOTE — Progress Notes (Signed)
   [facility] Visit  Patient name: Kristen Lang MRN [id number]  Date of birth: 1960/06/08  Patient ID: [name], female   DOB: 11-Feb-1961, 52 y.o.   MRN: [id number] Pt here today to follow up from last visit. Pt states that during the [place] yesterday that she was told she had a lot of stool and states that she is taking the miralax and goes everyday, but is concerned. Pt was seen Monday by Victorino Dike, labs were drawn and pelvic exam done.  Ca 125 drawn, results NORMAL at 6.6    ROS:  Bowel function normally daily,, tho u/s shows a lot of stool. denies ovary pain, or dyspareunia.  Pertinent History Reviewed:  Medical & Surgical Hx:  Reviewed: Significant for supracervical hyst Medications: Reviewed & Updated - see associated section Social History: Reviewed -  reports that she has been smoking Cigarettes.  She has a 2.25 pack-year smoking history. She has never used smokeless tobacco.  Objective Findings:  Vitals: BP 140/86  Ht 5\' 7"  (1.702 m)  Wt 161 lb (73.029 kg)  BMI 25.21 kg/m2  Physical Examination: General appearance - alert, well appearing, and in no distress, oriented to person, place, and time and normal appearing weight Mental status - alert, oriented to person, place, and time, normal mood, behavior, speech, dress, motor activity, and thought processes   Assessment & Plan:   Bilateral ovarian cysts, benign with normal ca125  Reassess 3 months with ca125 and u/s

## 2013-03-21 ENCOUNTER — Telehealth: Payer: Self-pay | Admitting: Adult Health

## 2013-03-21 NOTE — Telephone Encounter (Signed)
Left message that pap normal.

## 2013-06-18 ENCOUNTER — Other Ambulatory Visit: Payer: Self-pay | Admitting: Obstetrics and Gynecology

## 2013-06-18 DIAGNOSIS — R1900 Intra-abdominal and pelvic swelling, mass and lump, unspecified site: Secondary | ICD-10-CM

## 2013-06-20 ENCOUNTER — Other Ambulatory Visit: Payer: BC Managed Care – PPO

## 2013-06-20 ENCOUNTER — Ambulatory Visit: Payer: BC Managed Care – PPO | Admitting: Obstetrics and Gynecology

## 2014-03-10 ENCOUNTER — Encounter: Payer: Self-pay | Admitting: Obstetrics and Gynecology

## 2015-04-25 ENCOUNTER — Ambulatory Visit (INDEPENDENT_AMBULATORY_CARE_PROVIDER_SITE_OTHER): Payer: BC Managed Care – PPO

## 2015-04-25 ENCOUNTER — Ambulatory Visit (INDEPENDENT_AMBULATORY_CARE_PROVIDER_SITE_OTHER): Payer: BC Managed Care – PPO | Admitting: Family Medicine

## 2015-04-25 VITALS — BP 122/72 | HR 75 | Temp 98.1°F | Resp 18 | Ht 67.0 in | Wt 173.0 lb

## 2015-04-25 DIAGNOSIS — M792 Neuralgia and neuritis, unspecified: Secondary | ICD-10-CM | POA: Diagnosis not present

## 2015-04-25 DIAGNOSIS — R0789 Other chest pain: Secondary | ICD-10-CM

## 2015-04-25 DIAGNOSIS — R05 Cough: Secondary | ICD-10-CM

## 2015-04-25 DIAGNOSIS — R059 Cough, unspecified: Secondary | ICD-10-CM

## 2015-04-25 DIAGNOSIS — R058 Other specified cough: Secondary | ICD-10-CM

## 2015-04-25 LAB — POCT CBC
GRANULOCYTE PERCENT: 56.9 % (ref 37–80)
HCT, POC: 38.2 % (ref 37.7–47.9)
Hemoglobin: 12.8 g/dL (ref 12.2–16.2)
LYMPH, POC: 2.7 (ref 0.6–3.4)
MCH, POC: 30.9 pg (ref 27–31.2)
MCHC: 33.5 g/dL (ref 31.8–35.4)
MCV: 92.2 fL (ref 80–97)
MID (CBC): 0.3 (ref 0–0.9)
MPV: 6.6 fL (ref 0–99.8)
PLATELET COUNT, POC: 232 10*3/uL (ref 142–424)
POC Granulocyte: 3.9 (ref 2–6.9)
POC LYMPH %: 38.5 % (ref 10–50)
POC MID %: 4.6 %M (ref 0–12)
RBC: 4.14 M/uL (ref 4.04–5.48)
RDW, POC: 13.7 %
WBC: 6.9 10*3/uL (ref 4.6–10.2)

## 2015-04-25 MED ORDER — AZITHROMYCIN 250 MG PO TABS: ORAL_TABLET | ORAL | Status: DC

## 2015-04-25 MED ORDER — BENZONATATE 100 MG PO CAPS: 100.0000 mg | ORAL_CAPSULE | Freq: Three times a day (TID) | ORAL | Status: DC | PRN

## 2015-04-25 MED ORDER — PREDNISONE 20 MG PO TABS: ORAL_TABLET | ORAL | Status: DC

## 2015-04-25 NOTE — Patient Instructions (Signed)
Drink plenty of fluids and get enough rest  Stay off work through tomorrow  You need to find the name of the neurologist/neurosurgeon/orthopedist who saw you and injected your back previously. You need to see them back to assess this numbness in your arm. If you need a referral please contact me.  Take azithromycin 2 pills initially, then 1 daily for 4 days. This is an antibiotic for possible infection  Take the prednisone 20 mg 3 pills daily for 2 days, then 2 daily for 2 days, then 1 daily for 2 days. This is best taken after breakfast so begin tomorrow morning. This is for inflammation in the lungs.  Take the benzonatate cough pills one or 2 pills 3 times daily as needed for cough  In addition to the benzonatate you can take over-the-counter Delsym for cough. Take this at bedtime. We cannot give some of the cough syrups because of your allergy to hydrocodone, so this is not quite as strong but hopefully will do the job.  Return if not improving or if worse at any time

## 2015-04-25 NOTE — Progress Notes (Signed)
Patient ID: Kristen Lang, female    DOB: 08-25-1960  Age: 54 y.o. MRN: [id number]  Chief Complaint  Patient presents with  . Chest Pain    x 2 weeks  . Cough  . Sinusitis  . Shortness of Breath    Subjective:   Patient has been coughing for a couple of months but is getting bad now. She is coughing enough at nighttime that her chest hurts. She has been bothered badly for last couple weeks. She has a rattling in her chest at night. She continues to smoke about half pack of cigarettes a day. She does not have a lot of head congestion. She has had a lot of numbness and discomfort down her right arm off and on for a long time but is getting worse. She had an injection of a year ago at [facility] in her neck or back. Says it's just a numbness and tingling mostly. She is able use it still well.  Current allergies, medications, problem list, past/family and social histories reviewed.  Objective:  BP 122/72 mmHg  Pulse 75  Temp(Src) 98.1 F (36.7 C)  Resp 18  Ht 5\' 7"  (1.702 m)  Wt 173 lb (78.472 kg)  BMI 27.09 kg/m2  SpO2 97%  No major distress but she is tearing for some reason. Her TMs are normal. Throat clear. Neck supple without nodes. Chest has a little coarse rattling in the left base posteriorly. Heart regular without murmurs. Right arm strength seems adequate .subjective numbness sensation.  Assessment & Plan:   Assessment: 1. Cough   2. Chest wall pain   3. Radicular pain in right arm   4. Post-viral cough syndrome       Plan: Chest x-ray and CBC  Orders Placed This Encounter  Procedures  . DG Chest 2 View    Order Specific Question:  Reason for Exam (SYMPTOM  OR DIAGNOSIS REQUIRED)    Answer:  cough, chest wall pain    Order Specific Question:  Is the patient pregnant?    Answer:  No    Order Specific Question:  Preferred imaging location?    Answer:  External  . POCT CBC   Results for orders placed or performed in visit on 04/25/15  POCT CBC   Result Value Ref Range   WBC 6.9 4.6 - 10.2 K/uL   Lymph, poc 2.7 0.6 - 3.4   POC LYMPH PERCENT 38.5 10 - 50 %L   MID (cbc) 0.3 0 - 0.9   POC MID % 4.6 0 - 12 %M   POC Granulocyte 3.9 2 - 6.9   Granulocyte percent 56.9 37 - 80 %G   RBC 4.14 4.04 - 5.48 M/uL   Hemoglobin 12.8 12.2 - 16.2 g/dL   HCT, POC 38.2 37.7 - 47.9 %   MCV 92.2 80 - 97 fL   MCH, POC 30.9 27 - 31.2 pg   MCHC 33.5 31.8 - 35.4 g/dL   RDW, POC 13.7 %   Platelet Count, POC 232 142 - 424 K/uL   MPV 6.6 0 - 99.8 fL   [facility] reading (PRIMARY) by  Dr. Linna Darner Normal chest  .   Meds ordered this encounter  Medications  . predniSONE (DELTASONE) 20 MG tablet    Sig: Take 3 daily for 2 days, then 2 daily for 2 days, then 1 daily for 2 days after breakfast for inflammation lungs    Dispense:  12 tablet    Refill:  0  .  azithromycin (ZITHROMAX) 250 MG tablet    Sig: Take 2 pills initially, then 1 daily for 4 days for possible infection    Dispense:  6 tablet    Refill:  0  . benzonatate (TESSALON) 100 MG capsule    Sig: Take 1-2 capsules (100-200 mg total) by mouth 3 (three) times daily as needed.    Dispense:  30 capsule    Refill:  0         Patient Instructions  Drink plenty of fluids and get enough rest  Stay off work through tomorrow  You need to find the name of the neurologist/neurosurgeon/orthopedist who saw you and injected your back previously. You need to see them back to assess this numbness in your arm. If you need a referral please contact me.  Take azithromycin 2 pills initially, then 1 daily for 4 days. This is an antibiotic for possible infection  Take the prednisone 20 mg 3 pills daily for 2 days, then 2 daily for 2 days, then 1 daily for 2 days. This is best taken after breakfast so begin tomorrow morning. This is for inflammation in the lungs.  Take the benzonatate cough pills one or 2 pills 3 times daily as needed for cough  In addition to the benzonatate you can take  over-the-counter Delsym for cough. Take this at bedtime. We cannot give some of the cough syrups because of your allergy to hydrocodone, so this is not quite as strong but hopefully will do the job.  Return if not improving or if worse at any time       Return if symptoms worsen or fail to improve.   Haskel Dewalt, MD 04/25/2015

## 2015-05-11 ENCOUNTER — Encounter: Payer: Self-pay | Admitting: Radiology

## 2015-05-27 ENCOUNTER — Telehealth: Payer: Self-pay

## 2015-05-27 NOTE — Telephone Encounter (Signed)
Pt. Left a VM on the lab vm I am assuming for lab results but the only thing she had was an in house CBC. Tried to call her back to see if that is what she is calling about but was unable to reach her left a vm to call back

## 2015-11-09 ENCOUNTER — Encounter (HOSPITAL_COMMUNITY): Payer: Self-pay | Admitting: *Deleted

## 2015-11-09 ENCOUNTER — Emergency Department (HOSPITAL_COMMUNITY): Payer: BC Managed Care – PPO

## 2015-11-09 ENCOUNTER — Emergency Department (HOSPITAL_COMMUNITY)
Admission: EM | Admit: 2015-11-09 | Discharge: 2015-11-09 | Disposition: A | Discharge status: Home / Self Care | Payer: BC Managed Care – PPO | Attending: Emergency Medicine | Admitting: Emergency Medicine

## 2015-11-09 DIAGNOSIS — M5136 Other intervertebral disc degeneration, lumbar region: Secondary | ICD-10-CM | POA: Insufficient documentation

## 2015-11-09 DIAGNOSIS — F17210 Nicotine dependence, cigarettes, uncomplicated: Secondary | ICD-10-CM | POA: Diagnosis not present

## 2015-11-09 DIAGNOSIS — M545 Low back pain: Secondary | ICD-10-CM | POA: Diagnosis present

## 2015-11-09 MED ORDER — DICLOFENAC SODIUM 75 MG PO TBEC: 75.0000 mg | DELAYED_RELEASE_TABLET | Freq: Two times a day (BID) | ORAL | Status: DC

## 2015-11-09 MED ORDER — DEXAMETHASONE 4 MG PO TABS: 4.0000 mg | ORAL_TABLET | Freq: Two times a day (BID) | ORAL | Status: DC

## 2015-11-09 MED ORDER — OXYCODONE-ACETAMINOPHEN 5-325 MG PO TABS
1.0000 | ORAL_TABLET | Freq: Once | ORAL | Status: AC
  Administered 2015-11-09: 1 via ORAL
  Filled 2015-11-09: qty 1

## 2015-11-09 MED ORDER — DEXAMETHASONE SODIUM PHOSPHATE 4 MG/ML IJ SOLN
8.0000 mg | Freq: Once | INTRAMUSCULAR | Status: AC
  Administered 2015-11-09: 8 mg via INTRAMUSCULAR
  Filled 2015-11-09: qty 2

## 2015-11-09 MED ORDER — OXYCODONE-ACETAMINOPHEN 5-325 MG PO TABS: 1.0000 | ORAL_TABLET | Freq: Four times a day (QID) | ORAL | Status: DC | PRN

## 2015-11-09 MED ORDER — DIAZEPAM 5 MG PO TABS
10.0000 mg | ORAL_TABLET | Freq: Once | ORAL | Status: AC
  Administered 2015-11-09: 10 mg via ORAL
  Filled 2015-11-09: qty 2

## 2015-11-09 NOTE — ED Provider Notes (Signed)
CSN: [id number]     Arrival date & time 11/09/15  1431 History  By signing my name below, I, Georgette Shell, attest that this documentation has been prepared under the direction and in the presence of Lily Kocher, PA-C. Electronically Signed: Georgette Shell, ED Scribe. 11/09/2015. 3:51 PM.   Chief Complaint  Patient presents with  . Back Pain   The history is provided by the patient. No language interpreter was used.   HPI Comments: Kristen Lang is a 55 y.o. female who presents to the Emergency Department complaining of sudden onset, constant lower back pain onset one day ago. Pt states she feels as if her legs are heavy and she notes that she stumbles when she walks. Patient is able to ambulate. Pt notes pain is exacerbated when standing and switching positions. Pt states she was at work when the pain started. Patient has no history of back related issues. No alleviating factors noted. She denies any injuries.   Past Medical History  Diagnosis Date  . Allergy    Past Surgical History  Procedure Laterality Date  . Abdominal hysterectomy     Family History  Problem Relation Age of Onset  . Diabetes Mother   . Hypertension Mother   . Cancer Father   . Hypertension Sister   . Heart disease Sister   . Hypertension Brother   . Diabetes Brother   . Cancer Maternal Grandfather   . Cancer Paternal Grandfather   . Hypertension Sister   . Hypertension Sister   . Hypertension Sister   . Hypertension Brother   . Heart disease Brother   . Hypertension Brother   . Hypertension Brother   . Drug abuse Brother    Social History  Substance Use Topics  . Smoking status: Current Every Day Smoker -- 0.25 packs/day for 9 years    Types: Cigarettes  . Smokeless tobacco: Never Used  . Alcohol Use: 3.6 oz/week    6 Cans of beer per week     Comment: occ.   OB History    Gravida Para Term Preterm AB TAB SAB Ectopic Multiple Living   2 2        2      Review of Systems  Constitutional:  Negative for fever.  Genitourinary: Negative for dysuria.  Musculoskeletal: Positive for back pain.  All other systems reviewed and are negative.    Allergies  Dilaudid and Hydrocodone  Home Medications   Prior to Admission medications   Medication Sig Start Date End Date Taking? Authorizing Provider  azithromycin (ZITHROMAX) 250 MG tablet Take 2 pills initially, then 1 daily for 4 days for possible infection 04/25/15   Posey Boyer, MD  benzonatate (TESSALON) 100 MG capsule Take 1-2 capsules (100-200 mg total) by mouth 3 (three) times daily as needed. 04/25/15   Posey Boyer, MD  oxyCODONE-acetaminophen (PERCOCET) 5-325 MG per tablet Take 1-2 tablets by mouth every 4 (four) hours as needed for pain. Patient not taking: Reported on 04/25/2015 03/11/13   Orpah Greek, MD  polyethylene glycol [facility] / GLYCOLAX) packet Take 17 g by mouth daily. Patient not taking: Reported on 04/25/2015 03/11/13   Orpah Greek, MD  predniSONE (DELTASONE) 20 MG tablet Take 3 daily for 2 days, then 2 daily for 2 days, then 1 daily for 2 days after breakfast for inflammation lungs 04/25/15   Posey Boyer, MD   BP 135/73 mmHg  Pulse 70  Temp(Src) 98.3 F (36.8 C) (Oral)  Resp  18  Ht 5\' 7"  (1.702 m)  Wt 167 lb (75.751 kg)  BMI 26.15 kg/m2  SpO2 100% Physical Exam  Constitutional: She is oriented to person, place, and time. She appears well-developed and well-nourished.  HENT:  Head: Normocephalic.  Eyes: Conjunctivae are normal.  Cardiovascular: Normal rate, regular rhythm and normal heart sounds.   Pulmonary/Chest: Effort normal. No respiratory distress.  Abdominal: Soft. Bowel sounds are normal. She exhibits no distension. There is no tenderness.  Musculoskeletal: Normal range of motion.  Pain with change of position at the lower lumbar. Left > right.  Neurological: She is alert and oriented to person, place, and time.  Grip is symmetrical. Pain with leg raise and knee  flexion on the left.   Skin: Skin is warm and dry.  Psychiatric: She has a normal mood and affect. Her behavior is normal.  Nursing note and vitals reviewed.   ED Course  Procedures  DIAGNOSTIC STUDIES: Oxygen Saturation is 100% on RA, normal by my interpretation.    COORDINATION OF CARE: 3:48 PM Discussed treatment plan with pt at bedside which includes back CT and pt agreed to plan.  Imaging Review Ct Lumbar Spine Wo Contrast  11/09/2015  CLINICAL DATA:  Low back pain. Worse with standing. Pain extends to the LEFT leg. Symptoms began yesterday. EXAM: CT LUMBAR SPINE WITHOUT CONTRAST TECHNIQUE: Multidetector CT imaging of the lumbar spine was performed without intravenous contrast administration. Multiplanar CT image reconstructions were also generated. COMPARISON:  None. FINDINGS: Segmentation: Normal. Alignment: 1 mm anterolisthesis L4-5 is facet mediated. There is also mild straightening of the normal lumbar lordosis. Vertebrae: No worrisome osseous lesion. Conus medullaris: Not evaluated. Paraspinal tissues: No evidence for hydronephrosis or paravertebral mass. There is moderate bladder distention, uncertain significance. Disc levels: L1-L2:  Normal parent L2-L3:  Normal. L3-L4:  Normal. L4-L5: 1 mm anterolisthesis. Facet arthropathy and ligamentum flavum hypertrophy. Central and leftward protrusion extends to the foramen. Osseous spurring. Mild to moderate spinal stenosis. LEFT L4 and LEFT L5 nerve root impingement are evident. L5-S1: Central and leftward protrusion. Mild facet arthropathy. No definite neural impingement. IMPRESSION: The dominant lumbar abnormality is at L4-5 where 1 mm anterolisthesis, posterior element hypertrophy along with central and leftward protrusion combine to result in stenosis with LEFT greater than RIGHT L4 and L5 nerve root impingement. The observed findings at L4-5 and elsewhere appear to represent chronic lumbar degenerative disc disease; there are no visible  acute findings such as compression deformity or traumatic subluxation. Electronically Signed   By: Staci Righter M.D.   On: 11/09/2015 16:48   I have personally reviewed and evaluated these images as part of my medical decision-making.   MDM  The CT scan of the lumbar spine reveals a left greater than right stenosis at the L4-L5 nerve root area with some impingement. I discussed the findings with the patient in terms which he understands, as well as her niece on. The patient is referred to orthopedics for additional management. There no gross neurologic deficits on limited exam. This no evidence for caudal equina or other emergent changes at this time. Prescription for Decadron, diclofenac, and Percocet given to the patient. Patient also advised to use warm compresses to the back or heating pad. Patient is in agreement and the family is in agreement with this discharge plan.    Final diagnoses:  DDD (degenerative disc disease), lumbar    **I have reviewed nursing notes, vital signs, and all appropriate lab and imaging results for this patient.*  **  I personally performed the services described in this documentation, which was scribed in my presence. The recorded information has been reviewed and is accurate.Lily Kocher, PA-C 11/09/15 1720  Milton Ferguson, MD 11/09/15 2248

## 2015-11-09 NOTE — Discharge Instructions (Signed)
The CT scan of your lumbar spine reveals degenerative disc disease at the L4-L5 level that is causing narrowing of your lumbar spine. It is felt that this is probably the cause of your pain, and maybe the change in your walking and standing. Please see the orthopedic specialist listed above for office appointment as soon as possible. Please use Decadron and diclofenac 2 times daily with food. May use on Percocet for pain if needed. Percocet may cause drowsiness, and/or constipation. Please do not drive, operate machinery, or participate in activities requiring concentration when taking this medication. You may need to use a stool softener while taking the Percocet medication. Degenerative Disk Disease Degenerative disk disease is a condition caused by the changes that occur in spinal disks as you grow older. Spinal disks are soft and compressible disks located between the bones of your spine (vertebrae). These disks act like shock absorbers. Degenerative disk disease can affect the whole spine. However, the neck and lower back are most commonly affected. Many changes can occur in the spinal disks with aging, such as:  The spinal disks may dry and shrink.  Small tears may occur in the tough, outer covering of the disk (annulus).  The disk space may become smaller due to loss of water.  Abnormal growths in the bone (spurs) may occur. This can put pressure on the nerve roots exiting the spinal canal, causing pain.  The spinal canal may become narrowed. RISK FACTORS   Being overweight.  Having a family history of degenerative disk disease.  Smoking.  There is increased risk if you are doing heavy lifting or have a sudden injury. SIGNS AND SYMPTOMS  Symptoms vary from person to person and may include:  Pain that varies in intensity. Some people have no pain, while others have severe pain. The location of the pain depends on the part of your backbone that is affected.  You will have neck or arm  pain if a disk in the neck area is affected.  You will have pain in your back, buttocks, or legs if a disk in the lower back is affected.  Pain that becomes worse while bending, reaching up, or with twisting movements.  Pain that may start gradually and then get worse as time passes. It may also start after a major or minor injury.  Numbness or tingling in the arms or legs. DIAGNOSIS  Your health care provider will ask you about your symptoms and about activities or habits that may cause the pain. He or she may also ask about any injuries, diseases, or treatments you have had. Your health care provider will examine you to check for the range of movement that is possible in the affected area, to check for strength in your extremities, and to check for sensation in the areas of the arms and legs supplied by different nerve roots. You may also have:   An X-ray of the spine.  Other imaging tests, such as MRI. TREATMENT  Your health care provider will advise you on the best plan for treatment. Treatment may include:  Medicines.  Rehabilitation exercises. HOME CARE INSTRUCTIONS   Follow proper lifting and walking techniques as advised by your health care provider.  Maintain good posture.  Exercise regularly as advised by your health care provider.  Perform relaxation exercises.  Change your sitting, standing, and sleeping habits as advised by your health care provider.  Change positions frequently.  Lose weight or maintain a healthy weight as advised by your health  care provider.  Do not use any tobacco products, including cigarettes, chewing tobacco, or electronic cigarettes. If you need help quitting, ask your health care provider.  Wear supportive footwear.  Take medicines only as directed by your health care provider. SEEK MEDICAL CARE IF:   Your pain does not go away within 1-4 weeks.  You have significant appetite or weight loss. SEEK IMMEDIATE MEDICAL CARE IF:   Your  pain is severe.  You notice weakness in your arms, hands, or legs.  You begin to lose control of your bladder or bowel movements.  You have fevers or night sweats. MAKE SURE YOU:   Understand these instructions.  Will watch your condition.  Will get help right away if you are not doing well or get worse.   This information is not intended to replace advice given to you by your health care provider. Make sure you discuss any questions you have with your health care provider.   Document Released: 02/20/2007 Document Revised: 05/16/2014 Document Reviewed: 08/27/2013 Elsevier Interactive Patient Education [employer].

## 2015-11-09 NOTE — ED Notes (Signed)
Pt comes in with back pain that started yesterday while she was at work. Pt states her pain is worse and when standing her pain moves into her left leg. Pt denies any urinary problems.

## 2015-12-02 ENCOUNTER — Ambulatory Visit (HOSPITAL_COMMUNITY): Payer: BC Managed Care – PPO | Attending: Physical Medicine and Rehabilitation | Admitting: Physical Therapy

## 2015-12-02 ENCOUNTER — Encounter (HOSPITAL_COMMUNITY): Payer: Self-pay

## 2015-12-02 ENCOUNTER — Telehealth (HOSPITAL_COMMUNITY): Payer: Self-pay

## 2015-12-02 DIAGNOSIS — M545 Low back pain: Secondary | ICD-10-CM | POA: Insufficient documentation

## 2015-12-02 DIAGNOSIS — R29898 Other symptoms and signs involving the musculoskeletal system: Secondary | ICD-10-CM | POA: Insufficient documentation

## 2015-12-02 DIAGNOSIS — R293 Abnormal posture: Secondary | ICD-10-CM | POA: Insufficient documentation

## 2015-12-02 DIAGNOSIS — M6281 Muscle weakness (generalized): Secondary | ICD-10-CM | POA: Insufficient documentation

## 2015-12-03 ENCOUNTER — Ambulatory Visit (HOSPITAL_COMMUNITY): Payer: BC Managed Care – PPO | Admitting: Physical Therapy

## 2015-12-03 DIAGNOSIS — M545 Low back pain: Secondary | ICD-10-CM | POA: Diagnosis not present

## 2015-12-03 DIAGNOSIS — R293 Abnormal posture: Secondary | ICD-10-CM | POA: Diagnosis present

## 2015-12-03 DIAGNOSIS — R29898 Other symptoms and signs involving the musculoskeletal system: Secondary | ICD-10-CM

## 2015-12-03 DIAGNOSIS — M6281 Muscle weakness (generalized): Secondary | ICD-10-CM

## 2015-12-03 NOTE — Therapy (Signed)
[place], [place], [address] Phone: [phone]   Fax:  [phone]  Physical Therapy Evaluation  Patient Details  Name: Kristen Lang MRN: [id number] Date of Birth: 1960-06-15 Referring Provider: Suella Broad   Encounter Date: 12/03/2015      PT End of Session - 12/03/15 1749    Visit Number 1   Number of Visits 12   Date for PT Re-Evaluation 12/24/15   Authorization Type BCBS/T[employer]    Authorization Time Period 12/03/15 to 01/14/16   PT Start Time 1607   PT Stop Time 1645   PT Time Calculation (min) 38 min   Activity Tolerance Patient tolerated treatment well   Behavior During Therapy [facility] for tasks assessed/performed      Past Medical History:  Diagnosis Date  . Allergy     Past Surgical History:  Procedure Laterality Date  . ABDOMINAL HYSTERECTOMY      There were no vitals filed for this visit.       Subjective Assessment - 12/03/15 1610    Subjective Patient reports that she woke up one morning and could not get up. The pain is more on the left side of her low back and goes down into her butt cheek, it can go down her left leg and into her knee. When she walks she feels like she is not lifting her leg up high enough and she has been stumbling a bit. She has had some close calls with falling via frequent tripping since her pain started. Lifting items and sleeping tends to make her pain worse, first thing in the morning it is also quite painful. No numbness or tingling going anywhere.    Pertinent History no significant PMH    How long can you sit comfortably? 7/27- no limit but it is painful, she has to stand up/move around to reduce pain   How long can you stand comfortably? 7/27- 20 minutes    How long can you walk comfortably? 7/27- no consistent pain when walking, favoring L LE    Diagnostic tests CT done 11/09/15: L4-L5 anterolisthesis, spinal stenosis, L L4-L5 nerve impingement, chronic  lumbar degenerative disease    Patient Stated Goals get rid of pain, get moving better, get back to work    Currently in Pain? 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 Nelva Bush August 12th      Balance Screen   Has the patient fallen in the past 6 months No   Has the patient had a decrease in activity level because of a fear of falling?  Yes   Is the patient reluctant to leave their home because of a fear  of falling?  No     Prior Function   Level of Independence Independent;Independent with basic ADLs;Independent with gait;Independent with transfers   Vocation Full time employment   Oceanographer and custodial work    Leisure bowling, softball, motorcycle riding      Observation/Other Assessments   Observations FABER (+) L hip (-) R; scour (-) B; straight leg raise test (+) L LE    Focus on Therapeutic Outcomes (FOTO)  58% limited      Posture/Postural Control   Posture/Postural Control Postural limitations   Postural Limitations Rounded Shoulders;Forward head;Increased lumbar lordosis   Posture Comments excessive lumbar lordosis      AROM   Overall AROM Comments R hip ROM WFL; L hip ROM limited by muscle guarding/pain    Lumbar Flexion 65   Lumbar Extension --  DNT due to anterolisthesis    Lumbar - Right Side Bend WFL    Lumbar - Left Side  Bend [facility]      Strength   Right Hip Flexion 5/5   Right Hip Extension 2+/5   Right Hip ABduction 5/5   Left Hip Flexion 5/5   Left Hip Extension 2/5   Left Hip ABduction 3/5   Right Knee Flexion 4/5   Right Knee Extension 5/5   Left Knee Flexion 4/5   Left Knee Extension 4/5   Right Ankle Dorsiflexion 5/5   Left Ankle Dorsiflexion 4/5     Flexibility   Hamstrings mild limitation    Piriformis muscle guarding, pain with limited stretch      Palpation   Palpation comment tender muscle knots noted throughout L ITB and specific area of L piriformis      Ambulation/Gait   Gait Comments proximal muscle weakness     6 minute walk test results    Aerobic Endurance Distance Walked 565   Endurance additional comments 3MWT      Timed Up and Go Test   Normal TUG (seconds) 10.43                           PT Education - 12/03/15 1748    Education provided Yes   Education Details prognosis, HEP, POC    Person(s) Educated Patient   Methods Explanation;Demonstration;Handout   Comprehension Verbalized understanding;Returned demonstration;Need further instruction          PT Short Term Goals - 12/03/15 1752      PT SHORT TERM GOAL #1   Title Patient to demonstrate ability to maintain correct posture at least 70% of the time and resultant pain no more than 3/10 in order to improve QOL    Time 3   Period Weeks   Status New     PT SHORT TERM GOAL #2   Title Patient to report she is experiencing no exacerbation of pain during all functional transfers in order to improve QOL    Time 3   Period Weeks   Status New     PT SHORT TERM GOAL #3   Title Patient to demonstrate reduced sensitivity of L piriformis and reduced sciatic nerve related pain in order to improved overall condition and QOL    Time 3   Period Weeks   Status New     PT SHORT TERM GOAL #4   Title Patient to be independent in correctly and consistently performing appropriate HEP, to be updated  PRN    Time 1   Period Weeks  Status New           PT Long Term Goals - 12/03/15 1754      PT LONG TERM GOAL #1   Title Patient to demonstrate strength 5/5 in all tested muscle groups in order to assist in reducing pain and improving function    Time 6   Period Weeks   Status New     PT LONG TERM GOAL #2   Title Patient to be able to ambulate 735ft during 3MWT in order to demonstrate improved mobility for community ambulation and return to work    Time 6   Period Weeks   Status New     PT LONG TERM GOAL #3   Title Patient to be able to maintain SLS for at least 30 seconds each LE in order to demonstrate improved balance as well as improved abiltiy to safely return to dynamic tasks    Time 6   Period Weeks   Status New     PT LONG TERM GOAL #4   Title Patient to experience pain no more than 1/10 with no radicular or sciatic symptoms in order to allow her to return to work and sports activities    Time [place] - 12/03/15 1750    Clinical Impression Statement Patient arrives reporting L sided LBP that has been present for approximately one month; she states that it gets worse with extended time in standing/sitting positions, and that the 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 Since the pain has started, she feels her L LE is weaker and she has been stumbling more. Upon examination, noted severe postural limitations including significant excessive lumbar lordosis, functional muscle weakness, mild impairment in balance, reduced muscle flexibility, impaired gait tolerance and mechanics, and possible inflammation of L piriformis muscle as indicated by palpable specific muscle tenderness and pattern of sciatic pain that may be contributing to condition. At this point recommend skilled PT services in order to address functional limitations and assist in reaching optimal level of function.    Rehab  Potential Good   PT Frequency 2x / week   PT Duration 6 weeks   PT Treatment/Interventions ADLs/Self Care Home Management;Biofeedback;Cryotherapy;Moist Heat;Gait training;Stair training;Functional mobility training;Therapeutic activities;Therapeutic exercise;Balance training;Neuromuscular re-education;Patient/family education;Manual techniques;Passive range of motion;Energy conservation;Taping   PT Next Visit Plan review HEP and goals; add pelvic clocks to HEP if she can do successfully. Postural training, functional strengthening, biomechanics. AVOID excessive lumbar extension due to anterolisthesis.    PT Home Exercise Plan given    Consulted and Agree with Plan of Care Patient      Patient will benefit from skilled therapeutic intervention in order to improve the following deficits and impairments:  Abnormal gait, Improper body mechanics, Pain, Decreased coordination, Decreased mobility, Increased muscle spasms, Postural dysfunction, Hypomobility, Decreased strength, Decreased balance, Difficulty walking, Impaired flexibility  Visit Diagnosis: Left low back pain, with sciatica presence unspecified - Plan: PT plan of care cert/re-cert  Abnormal posture - Plan: PT plan of care cert/re-cert  Muscle weakness (generalized) - Plan: PT plan of care cert/re-cert  Other symptoms and signs involving the musculoskeletal system - Plan: PT plan of care cert/re-cert     Problem List Patient Active Problem List   Diagnosis Date Noted  . Pelvic mass in female 03/13/2013    Deniece Ree PT, DPT [phone]  [place], [place], [address] Phone: [phone]   Fax:  [phone]  Name: Kristen Lang MRN: [id number] Date of Birth: 04-28-1961

## 2015-12-03 NOTE — Patient Instructions (Signed)
   SHOULDER ROLLS  Move your shoulders in a circular pattern as shown so that your are moving in an up, back and down direction. Perform small cicles if needed for comfort.  Repeat 10 times, 2-3 times per day.    Transverse Abdominus Activation  Lying on your back, pull your bellybutton into your spine. It should feel like you are tensing all of the muscles on the front of your stomach.  Hold for 3 seconds and repeat 20 times, 3-5 times per day.

## 2015-12-04 ENCOUNTER — Ambulatory Visit (HOSPITAL_COMMUNITY): Payer: BC Managed Care – PPO | Admitting: Physical Therapy

## 2015-12-04 DIAGNOSIS — R29898 Other symptoms and signs involving the musculoskeletal system: Secondary | ICD-10-CM

## 2015-12-04 DIAGNOSIS — M545 Low back pain: Secondary | ICD-10-CM

## 2015-12-04 DIAGNOSIS — M6281 Muscle weakness (generalized): Secondary | ICD-10-CM

## 2015-12-04 DIAGNOSIS — R293 Abnormal posture: Secondary | ICD-10-CM

## 2015-12-04 NOTE — Patient Instructions (Addendum)
Hamstring Stretch: Active    Support behind right knee. Starting with knee bent, attempt to straighten knee until a comfortable stretch is felt in back of thigh. Hold _30___ seconds. Repeat ___3_ times per set. Do ___1_ sets per session. Do __2__ sessions per day.  http://orth.exer.us/158   Copyright  VHI. All rights reserved.  Knee-to-Chest Stretch: Unilateral    With hand behind right knee, pull knee in to chest until a comfortable stretch is felt in lower back and buttocks. Keep back relaxed. Hold _30___ seconds. Repeat __3__ times per set. Do __1__ sets per session. Do __2_ sessions per day.  http://orth.exer.us/126   Copyright  VHI. All rights reserved.  Bent Leg Lift (Hook-Lying)    Tighten stomach and slowly raise right leg _3___ inches from floor. Keep trunk rigid. Hold __2__ seconds. Repeat ___10_ times per set. Do ___1_ sets per session. Do __2__ sessions per day.  http://orth.exer.us/1090   Copyright  VHI. All rights reserved.  Bridging    Slowly raise buttocks from floor, keeping stomach tight. Repeat 10____ times per set. Do __1__ sets per session. Do ___2_ sessions per day.  http://orth.exer.us/1096   Copyright  VHI. All rights reserved.  Pelvic Tilt    Flatten back by tightening stomach muscles and buttocks. Repeat _10___ times per set. Do __1__ sets per session. Do __2__ sessions per day.  http://orth.exer.us/134   Copyright  VHI. All rights reserved.

## 2015-12-04 NOTE — Therapy (Signed)
[place] [address] [place], [place], [address] Phone: [phone]   Fax:  [phone]  Physical Therapy Treatment  Patient Details  Name: Kristen Lang MRN: [id number] Date of Birth: 1960/11/13 Referring Provider: Suella Broad   Encounter Date: 12/04/2015      PT End of Session - 12/04/15 0918    Visit Number 2   Number of Visits 12   Date for PT Re-Evaluation 12/24/15   Authorization Type BCBS/Tricare    Authorization Time Period 12/03/15 to 01/14/16   Authorization - Visit Number 2   Authorization - Number of Visits 10   PT Start Time 0905   PT Stop Time 0948   PT Time Calculation (min) 43 min   Activity Tolerance Patient tolerated treatment well   Behavior During Therapy [facility] for tasks assessed/performed      Past Medical History:  Diagnosis Date  . Allergy     Past Surgical History:  Procedure Laterality Date  . ABDOMINAL HYSTERECTOMY      There were no vitals filed for this visit.      Subjective Assessment - 12/04/15 0911    Subjective Pt states that her pain level has not changed.     Pertinent History no significant PMH    How long can you sit comfortably? 7/27- no limit but it is painful, she has to stand up/move around to reduce pain   How long can you stand comfortably? 7/27- 20 minutes    How long can you walk comfortably? 7/27- no consistent pain when walking, favoring L LE    Diagnostic tests CT done 11/09/15: L4-L5 anterolisthesis, spinal stenosis, L L4-L5 nerve impingement, chronic lumbar degenerative disease    Patient Stated Goals get rid of pain, get moving better, get back to work    Currently in Pain? Yes   Pain Score 6    Pain Location Back   Pain Orientation Left   Pain Descriptors / Indicators Aching   Pain Type Chronic pain   Pain Radiating Towards knee   Pain Onset 1 to 4 weeks ago   Pain Frequency Intermittent                         [facility] Adult PT  Treatment/Exercise - 12/04/15 0001      Exercises   Exercises Lumbar     Lumbar Exercises: Stretches   Active Hamstring Stretch 3 reps;20 seconds   Single Knee to Chest Stretch 3 reps;30 seconds   Pelvic Tilt 5 reps     Lumbar Exercises: Supine   Ab Set 10 reps   Bent Knee Raise 10 reps   Bridge 10 reps     Lumbar Exercises: Sidelying   Hip Abduction 10 reps                PT Education - 12/04/15 0918    Education provided Yes   Education Details new HEP   Person(s) Educated Patient   Methods Explanation;Verbal cues;Handout   Comprehension Verbalized understanding;Returned demonstration          PT Short Term Goals - 12/04/15 0930      PT SHORT TERM GOAL #1   Title Patient to demonstrate ability to maintain correct posture at least 70% of the time and resultant pain no more than 3/10 in order to improve QOL    Time 3   Period Weeks   Status On-going     PT SHORT TERM GOAL #  2   Title Patient to report she is experiencing no exacerbation of pain during all functional transfers in order to improve QOL    Time 3   Period Weeks   Status On-going     PT SHORT TERM GOAL #3   Title Patient to demonstrate reduced sensitivity of L piriformis and reduced sciatic nerve related pain in order to improved overall condition and QOL    Time 3   Period Weeks   Status On-going     PT SHORT TERM GOAL #4   Title Patient to be independent in correctly and consistently performing appropriate HEP, to be updated PRN    Time 1   Period Weeks   Status On-going           PT Long Term Goals - 12/04/15 0931      PT LONG TERM GOAL #1   Title Patient to demonstrate strength 5/5 in all tested muscle groups in order to assist in reducing pain and improving function    Time 6   Period Weeks   Status On-going     PT LONG TERM GOAL #2   Title Patient to be able to ambulate 719ft during 3MWT in order to demonstrate improved mobility for community ambulation and return to work     Time 6   Period Weeks   Status On-going     PT LONG TERM GOAL #3   Title Patient to be able to maintain SLS for at least 30 seconds each LE in order to demonstrate improved balance as well as improved abiltiy to safely return to dynamic tasks    Time 6   Period Weeks   Status On-going     PT LONG TERM GOAL #4   Title Patient to experience pain no more than 1/10 with no radicular or sciatic symptoms in order to allow her to return to work and sports activities    Time 6   Period Weeks   Status On-going               Plan - 12/04/15 [id number]    Clinical Impression Statement Evaluation and goals reviewed with pt.  Instructed pt in new stretching and strengthening exercises with HEP given.  Pt able to demonstrate correct technique of exercise after verbal and tactile cuing.    Rehab Potential Good   PT Frequency 2x / week   PT Duration 6 weeks   PT Treatment/Interventions ADLs/Self Care Home Management;Biofeedback;Cryotherapy;Moist Heat;Gait training;Stair training;Functional mobility training;Therapeutic activities;Therapeutic exercise;Balance training;Neuromuscular re-education;Patient/family education;Manual techniques;Passive range of motion;Energy conservation;Taping   PT Next Visit Plan Postural training, stretching and functional strengthening.  Begin heel raises, sit to stand next treatment. No excessive lumbar extension.    PT Home Exercise Plan given    Consulted and Agree with Plan of Care Patient      Patient will benefit from skilled therapeutic intervention in order to improve the following deficits and impairments:  Abnormal gait, Improper body mechanics, Pain, Decreased coordination, Decreased mobility, Increased muscle spasms, Postural dysfunction, Hypomobility, Decreased strength, Decreased balance, Difficulty walking, Impaired flexibility  Visit Diagnosis: Left low back pain, with sciatica presence unspecified  Abnormal posture  Muscle weakness  (generalized)  Other symptoms and signs involving the musculoskeletal system     Problem List Patient Active Problem List   Diagnosis Date Noted  . Pelvic mass in female 03/13/2013    Rayetta Humphrey, PT CLT [phone] 12/04/2015, 10:33 AM  [place] [address]  [place], [place], [address] Phone: [phone]   Fax:  [phone]  Name: Kristen Lang MRN: [id number] Date of Birth: 1961-02-07

## 2015-12-09 ENCOUNTER — Ambulatory Visit (HOSPITAL_COMMUNITY): Payer: BC Managed Care – PPO | Attending: Physical Medicine and Rehabilitation

## 2015-12-09 DIAGNOSIS — R293 Abnormal posture: Secondary | ICD-10-CM | POA: Insufficient documentation

## 2015-12-09 DIAGNOSIS — R29898 Other symptoms and signs involving the musculoskeletal system: Secondary | ICD-10-CM | POA: Diagnosis present

## 2015-12-09 DIAGNOSIS — M545 Low back pain: Secondary | ICD-10-CM

## 2015-12-09 DIAGNOSIS — M6281 Muscle weakness (generalized): Secondary | ICD-10-CM | POA: Diagnosis present

## 2015-12-09 NOTE — Patient Instructions (Signed)
  PIRIFORMIS AND HIP STRETCH - SEATED  While sitting in a chair, cross your affected leg on top of the other as shown.   Next, gently lean forward until a stretch is felt along the crossed leg.  2 x 30 seconds each leg   SEATED HAMSTRING STRETCH  While seated, rest your heel on the floor with your knee straight and gently lean forward until a stretch is felt behind your knee/thigh.   2 x 30 seconds each leg    Chair Squat  Stand in front of chair of appropriate height(lower chair increases difficulty, higher chair is less difficult) Feet about shoulder width apart, sit hips back as if to sit into chair, keeping knees behind toes. Tap chair and return to upright standing, repeat.   **Keep knees from turning in, and do not let knee go over toes   2 x 10 reps

## 2015-12-09 NOTE — Therapy (Signed)
[place], [place], [address] Phone: [phone]   Fax:  [phone]  Physical Therapy Treatment  Patient Details  Name: Kristen Lang MRN: [id number] Date of Birth: September 01, 1960 Referring Provider: Suella Broad   Encounter Date: 12/09/2015      PT End of Session - 12/09/15 1657    Visit Number 3   Number of Visits 12   Date for PT Re-Evaluation 12/24/15   Authorization Type BCBS/[employer]    Authorization Time Period 12/03/15 to 01/14/16   Authorization - Visit Number 3   Authorization - Number of Visits 10   PT Start Time 1351   PT Stop Time 1437   PT Time Calculation (min) 46 min   Activity Tolerance Patient tolerated treatment well   Behavior During Therapy [facility] for tasks assessed/performed      Past Medical History:  Diagnosis Date  . Allergy     Past Surgical History:  Procedure Laterality Date  . ABDOMINAL HYSTERECTOMY      There were no vitals filed for this visit.      Subjective Assessment - 12/09/15 1352    Subjective Pt states that she stumbled, but did not go completely down.  Pt states she goes back to the doctor on the 12th.    Pertinent History no significant PMH    How long can you sit comfortably? 7/27- no limit but it is painful, she has to stand up/move around to reduce pain   How long can you stand comfortably? 7/27- 20 minutes    How long can you walk comfortably? 7/27- no consistent pain when walking, favoring L LE    Diagnostic tests CT done 11/09/15: L4-L5 anterolisthesis, spinal stenosis, L L4-L5 nerve impingement, chronic lumbar degenerative disease    Patient Stated Goals get rid of pain, get moving better, get back to work    Pain Score 3    Pain Location Back   Pain Orientation Left   Pain Descriptors / Indicators Aching   Pain Type Chronic pain   Pain Radiating Towards 8/2 - pt  states it is not radiating down the L leg anymore.    Pain Onset 1 to 4 weeks ago   Pain  Frequency Intermittent   Aggravating Factors  sitting or standing for too long   Pain Relieving Factors getting out of position, moving around.    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                          [place] Adult PT Treatment/Exercise - 12/09/15 0001      Ambulation/Gait   Gait Comments proximal muscle weakness     Posture/Postural Control   Posture/Postural Control Postural limitations   Postural Limitations Rounded Shoulders;Forward head;Increased lumbar lordosis   Posture Comments excessive lumbar lordosis      Exercises   Exercises Lumbar     Lumbar Exercises: Stretches   Active Hamstring Stretch 2 reps;30 seconds  seated   Piriformis Stretch 2 reps;30 seconds  seated     Lumbar Exercises: Standing   Functional Squats 10 reps  with chair cue to hold abdominals in tight.   Other Standing Lumbar Exercises heel raises standing 3 x 10 withB UE's for balance - cues for posture and to pull core in.      Lumbar Exercises: Supine   Ab Set 10 reps  Bent Knee Raise 10 reps  Pt states that it is getting easier.       Lumbar Exercises: Quadruped   Straight Leg Raise 5 reps;Limitations  dowel rod for feedback     Manual Therapy   Manual Therapy Soft tissue mobilization   Manual therapy comments soft tissue mobilization completed seperate from other therapeutic activities or exercises.    Soft tissue mobilization L piriformis, as well as L ITB and hamstring with pt in sidelying.  Use of roller stick.  L piriformis trigger point release, as well as ITB trigger point release.                  PT Education - 12/09/15 1656    Education provided Yes   Education Details Reviewed HEP, and educated importance of stretching, as well as posture while performing all activities.    Person(s) Educated Patient   Methods Explanation;Verbal cues;Handout   Comprehension Verbalized  understanding;Returned demonstration          PT Short Term Goals - 12/09/15 1701      PT SHORT TERM GOAL #1   Title Patient to demonstrate ability to maintain correct posture at least 70% of the time and resultant pain no more than 3/10 in order to improve QOL    Time 3   Period Weeks   Status On-going     PT SHORT TERM GOAL #2   Title Patient to report she is experiencing no exacerbation of pain during all functional transfers in order to improve QOL    Time 3   Period Weeks   Status On-going     PT SHORT TERM GOAL #3   Title Patient to demonstrate reduced sensitivity of L piriformis and reduced sciatic nerve related pain in order to improved overall condition and QOL    Time 3   Period Weeks   Status On-going     PT SHORT TERM GOAL #4   Title Patient to be independent in correctly and consistently performing appropriate HEP, to be updated PRN    Period Weeks   Status On-going           PT Long Term Goals - 12/09/15 1702      PT LONG TERM GOAL #1   Title Patient to demonstrate strength 5/5 in all tested muscle groups in order to assist in reducing pain and improving function    Time 6   Period Weeks   Status On-going     PT LONG TERM GOAL #2   Title Patient to be able to ambulate 738ft during 3MWT in order to demonstrate improved mobility for community ambulation and return to work    Time 6   Period Weeks   Status On-going     PT LONG TERM GOAL #3   Title Patient to be able to maintain SLS for at least 30 seconds each LE in order to demonstrate improved balance as well as improved abiltiy to safely return to dynamic tasks    Time 6   Period Weeks   Status On-going     PT LONG TERM GOAL #4   Title Patient to experience pain no more than 1/10 with no radicular or sciatic symptoms in order to allow her to return to work and sports activities    Time 6   Period Weeks   Status On-going               Plan - 12/09/15 1657    Clinical Impression  Statement Today's tx focused on stretching, as well as stabilization, and ended with some soft tissue mobilization to the L piriformis and ITB.  Pt expressed that she has had some pain relief since inital evaluation and is now down to a 3/10.  Pt continues to demonstrate fatigue of deep abdominals during exercises.  She expressed relief with maual techniques performed today.  Pt demonstrates need for continued PT to decrease pain, improve posture, endurance, and strength for her to return to her occupation.    Rehab Potential Good   PT Frequency 2x / week   PT Duration 6 weeks   PT Treatment/Interventions ADLs/Self Care Home Management;Biofeedback;Cryotherapy;Moist Heat;Gait training;Stair training;Functional mobility training;Therapeutic activities;Therapeutic exercise;Balance training;Neuromuscular re-education;Patient/family education;Manual techniques;Passive range of motion;Energy conservation;Taping   PT Next Visit Plan continue postural training, continue stabilization with progression of quadruped, continue stretching.   PT Home Exercise Plan Piriformis stretch, hamstring stretch, and squats.    Consulted and Agree with Plan of Care Patient      Patient will benefit from skilled therapeutic intervention in order to improve the following deficits and impairments:  Abnormal gait, Improper body mechanics, Pain, Decreased coordination, Decreased mobility, Increased muscle spasms, Postural dysfunction, Hypomobility, Decreased strength, Decreased balance, Difficulty walking, Impaired flexibility  Visit Diagnosis: Left low back pain, with sciatica presence unspecified  Abnormal posture  Muscle weakness (generalized)  Other symptoms and signs involving the musculoskeletal system     Problem List Patient Active Problem List   Diagnosis Date Noted  . Pelvic mass in female 03/13/2013   [facility] Terril Amaro, PT, DPT X: [phone]   [place] [address] [place], [place], [address] Phone: [phone]   Fax:  [phone]  Name: Kristen Lang MRN: [id number] Date of Birth: 28-Jan-1961

## 2015-12-10 ENCOUNTER — Ambulatory Visit (HOSPITAL_COMMUNITY): Payer: BC Managed Care – PPO | Admitting: Physical Therapy

## 2015-12-10 DIAGNOSIS — M545 Low back pain: Secondary | ICD-10-CM | POA: Diagnosis not present

## 2015-12-10 DIAGNOSIS — R29898 Other symptoms and signs involving the musculoskeletal system: Secondary | ICD-10-CM

## 2015-12-10 DIAGNOSIS — R293 Abnormal posture: Secondary | ICD-10-CM

## 2015-12-10 DIAGNOSIS — M6281 Muscle weakness (generalized): Secondary | ICD-10-CM

## 2015-12-10 NOTE — Therapy (Signed)
[place] [address] [place], [place], [address] Phone: [phone]   Fax:  [phone]  Physical Therapy Treatment  Patient Details  Name: Kristen Lang MRN: [id number] Date of Birth: [birth year] Referring Provider: Suella Broad   Encounter Date: 12/10/2015      PT End of Session - 12/10/15 1345    Visit Number 4   Number of Visits 12   Date for PT Re-Evaluation 12/24/15   Authorization Type BCBS/[employer]    Authorization Time Period 12/03/15 to 01/14/16   Authorization - Visit Number 4   Authorization - Number of Visits 10   PT Start Time 1302   PT Stop Time 1345   PT Time Calculation (min) 43 min   Activity Tolerance Patient tolerated treatment well   Behavior During Therapy [facility] for tasks assessed/performed      Past Medical History:  Diagnosis Date  . Allergy     Past Surgical History:  Procedure Laterality Date  . ABDOMINAL HYSTERECTOMY      There were no vitals filed for this visit.      Subjective Assessment - 12/10/15 1303    Subjective Patient arrives today stating she is doing well, her pain is staying low, around 3/10 for now    Pertinent History no significant PMH    Currently in Pain? Yes   Pain Score 3    Pain Location Back                         [facility] Adult PT Treatment/Exercise - 12/10/15 0001      Lumbar Exercises: Stretches   Active Hamstring Stretch 2 reps;30 seconds   Active Hamstring Stretch Limitations 12 inch box    Passive Hamstring Stretch 3 reps;30 seconds   Passive Hamstring Stretch Limitations gastroc on slantboard    Single Knee to Chest Stretch 5 reps;10 seconds   Lower Trunk Rotation 5 reps;10 seconds   Pelvic Tilt Limitations 10 reps 12-6    Piriformis Stretch 2 reps;30 seconds   Piriformis Stretch Limitations seated     Lumbar Exercises: Standing   Other Standing Lumbar Exercises sitting on air pad: alternate marches with core set, LAQs with core set,  march with contralateral rotation with core set      Lumbar Exercises: Supine   Ab Set 15 reps   AB Set Limitations 3 second holds    Bridge 10 reps   Other Supine Lumbar Exercises supine hip ABD with red TB 1x10     Lumbar Exercises: Quadruped   Straight Leg Raise 10 reps   Opposite Arm/Leg Raise 5 reps;Right arm/Left leg;Left arm/Right leg     Manual Therapy   Manual Therapy Soft tissue mobilization   Manual therapy comments soft tissue mobilization completed seperate from other therapeutic activities or exercises.    Soft tissue mobilization L piriformis, as well as L ITB and hamstring with pt in sidelying.  Use of roller stick.  L piriformis trigger point release, as well as ITB trigger point release.                  PT Education - 12/10/15 1345    Education provided No          PT Short Term Goals - 12/09/15 1701      PT SHORT TERM GOAL #1   Title Patient to demonstrate ability to maintain correct posture at least 70% of the time and resultant pain no more  than 3/10 in order to improve QOL    Time 3   Period Weeks   Status On-going     PT SHORT TERM GOAL #2   Title Patient to report she is experiencing no exacerbation of pain during all functional transfers in order to improve QOL    Time 3   Period Weeks   Status On-going     PT SHORT TERM GOAL #3   Title Patient to demonstrate reduced sensitivity of L piriformis and reduced sciatic nerve related pain in order to improved overall condition and QOL    Time 3   Period Weeks   Status On-going     PT SHORT TERM GOAL #4   Title Patient to be independent in correctly and consistently performing appropriate HEP, to be updated PRN    Period Weeks   Status On-going           PT Long Term Goals - 12/09/15 1702      PT LONG TERM GOAL #1   Title Patient to demonstrate strength 5/5 in all tested muscle groups in order to assist in reducing pain and improving function    Time 6   Period Weeks   Status  On-going     PT LONG TERM GOAL #2   Title Patient to be able to ambulate 766ft during 3MWT in order to demonstrate improved mobility for community ambulation and return to work    Time 6   Period Weeks   Status On-going     PT LONG TERM GOAL #3   Title Patient to be able to maintain SLS for at least 30 seconds each LE in order to demonstrate improved balance as well as improved abiltiy to safely return to dynamic tasks    Time 6   Period Weeks   Status On-going     PT LONG TERM GOAL #4   Title Patient to experience pain no more than 1/10 with no radicular or sciatic symptoms in order to allow her to return to work and sports activities    Time 6   Period Weeks   Status On-going               Plan - 12/10/15 1346    Clinical Impression Statement Continued POC of functional stabilization and stretching in order to address back pain, with addition of lumbar rotations today as well. Continued to progress quadruped activities today, increased reps and challenge by incorporating UE motion as well. Patient does continue to demonstrate easily fatigued core musculature and will benefit from ongoing challenging tasks for core musculature incorporated into her skilled sessions. Ended session with manual to L piriformis/ITB today as well due to patient reports of decreased pain after manual last session.    Rehab Potential Good   PT Frequency 2x / week   PT Duration 6 weeks   PT Treatment/Interventions ADLs/Self Care Home Management;Biofeedback;Cryotherapy;Moist Heat;Gait training;Stair training;Functional mobility training;Therapeutic activities;Therapeutic exercise;Balance training;Neuromuscular re-education;Patient/family education;Manual techniques;Passive range of motion;Energy conservation;Taping   PT Next Visit Plan continue postural training, continue stabilization with progression of quadruped, continue stretching.   Consulted and Agree with Plan of Care Patient      Patient will  benefit from skilled therapeutic intervention in order to improve the following deficits and impairments:  Abnormal gait, Improper body mechanics, Pain, Decreased coordination, Decreased mobility, Increased muscle spasms, Postural dysfunction, Hypomobility, Decreased strength, Decreased balance, Difficulty walking, Impaired flexibility  Visit Diagnosis: Left low back pain, with sciatica presence unspecified  Abnormal  posture  Muscle weakness (generalized)  Other symptoms and signs involving the musculoskeletal system     Problem List Patient Active Problem List   Diagnosis Date Noted  . Pelvic mass in female 03/13/2013    Deniece Ree PT, DPT [facility] [address] [place], [place], [address] Phone: [phone]   Fax:  [phone]  Name: Mearle Avilez MRN: [id number] Date of Birth: November 06, 1960

## 2015-12-11 NOTE — Telephone Encounter (Signed)
cx

## 2015-12-14 ENCOUNTER — Ambulatory Visit (HOSPITAL_COMMUNITY): Payer: BC Managed Care – PPO

## 2015-12-14 DIAGNOSIS — M545 Low back pain: Secondary | ICD-10-CM | POA: Diagnosis not present

## 2015-12-14 DIAGNOSIS — R29898 Other symptoms and signs involving the musculoskeletal system: Secondary | ICD-10-CM

## 2015-12-14 DIAGNOSIS — R293 Abnormal posture: Secondary | ICD-10-CM

## 2015-12-14 DIAGNOSIS — M6281 Muscle weakness (generalized): Secondary | ICD-10-CM

## 2015-12-14 NOTE — Therapy (Signed)
[place], [place], [address] Phone: [phone]   Fax:  [phone]  Physical Therapy Treatment  Patient Details  Name: Kristen Lang MRN: [id number] Date of Birth: 03/16/1961 Referring Provider: Suella Broad   Encounter Date: 12/14/2015      PT End of Session - 12/14/15 1324    Visit Number 5   Number of Visits 12   Date for PT Re-Evaluation 12/24/15   Authorization Type BCBS/[employer]    Authorization Time Period 12/03/15 to 01/14/16   Authorization - Visit Number 5   Authorization - Number of Visits 10   PT Start Time 1304   PT Stop Time 1342   PT Time Calculation (min) 38 min   Activity Tolerance Patient tolerated treatment well;Patient limited by pain   Behavior During Therapy [facility] for tasks assessed/performed      Past Medical History:  Diagnosis Date  . Allergy     Past Surgical History:  Procedure Laterality Date  . ABDOMINAL HYSTERECTOMY      There were no vitals filed for this visit.      Subjective Assessment - 12/14/15 1306    Subjective Pt reports she is doing well. Her HEP is goign well at home and pain continues to decrease. Mornings continue to remain to most painful time.    Pertinent History no significant PMH    Currently in Pain? Yes   Pain Score 2    Pain Location Back   Pain Orientation Left   Pain Descriptors / Indicators Aching                         [facility] Adult PT Treatment/Exercise - 12/14/15 0001      Exercises   Exercises Lumbar     Lumbar Exercises: Stretches   Active Hamstring Stretch 2 reps;30 seconds   Active Hamstring Stretch Limitations 12 inch box    Passive Hamstring Stretch Limitations gastroc on slantboard   3x30sec   Single Knee to Chest Stretch 2 reps;30 seconds  RLE only; LLE aggravated L-LBP and referral into HS   Double Knee to Chest Stretch Limitations Left knee to opposite chest  2x30    Lower Trunk Rotation --   Piriformis Stretch 2 reps;30 seconds  Supine; LLE aggravates L post hip symptoms     Lumbar Exercises: Standing   Other Standing Lumbar Exercises sitting on air pad: alternate marches with core set, LAQs with core set, march with contralateral rotation with core set   20x bilat     Lumbar Exercises: Supine   Other Supine Lumbar Exercises supine hip ABD with red TB 2x10  band around knees (pain stable; LLE weaker)      Lumbar Exercises: Quadruped   Straight Leg Raise 10 reps   Opposite Arm/Leg Raise Left arm/Right leg;Right arm/Left leg  unable to get into neutral spine posture without pain, defer     Manual Therapy   Manual Therapy Soft tissue mobilization   Soft tissue mobilization L psoas release x5 minutes                PT Education - 12/14/15 1346    Education provided No          PT Short Term Goals - 12/09/15 1701      PT SHORT TERM GOAL #1   Title Patient to demonstrate ability to maintain correct posture at least 70% of the time and resultant pain no more  than 3/10 in order to improve QOL    Time 3   Period Weeks   Status On-going     PT SHORT TERM GOAL #2   Title Patient to report she is experiencing no exacerbation of pain during all functional transfers in order to improve QOL    Time 3   Period Weeks   Status On-going     PT SHORT TERM GOAL #3   Title Patient to demonstrate reduced sensitivity of L piriformis and reduced sciatic nerve related pain in order to improved overall condition and QOL    Time 3   Period Weeks   Status On-going     PT SHORT TERM GOAL #4   Title Patient to be independent in correctly and consistently performing appropriate HEP, to be updated PRN    Period Weeks   Status On-going           PT Long Term Goals - 12/09/15 1702      PT LONG TERM GOAL #1   Title Patient to demonstrate strength 5/5 in all tested muscle groups in order to assist in reducing pain and improving function    Time 6   Period Weeks    Status On-going     PT LONG TERM GOAL #2   Title Patient to be able to ambulate 757ft during 3MWT in order to demonstrate improved mobility for community ambulation and return to work    Time 6   Period Weeks   Status On-going     PT LONG TERM GOAL #3   Title Patient to be able to maintain SLS for at least 30 seconds each LE in order to demonstrate improved balance as well as improved abiltiy to safely return to dynamic tasks    Time 6   Period Weeks   Status On-going     PT LONG TERM GOAL #4   Title Patient to experience pain no more than 1/10 with no radicular or sciatic symptoms in order to allow her to return to work and sports activities    Time 6   Period Weeks   Status On-going               Plan - 12/14/15 1334    Clinical Impression Statement Pt tolerates session well today withminimal increase in pain, but clear aggravation of sx with certain LLE activity. pelvis anterior to to the femoral head feels firm and edematous but it is difficultt to determine what is causing this. L psoas is painful with palpation from femoral attachment to L3 level, and releases to some degree, but remains limited.  Muslce knots remain along the Left lateral quads and L TFL.    Rehab Potential Good   PT Frequency 2x / week   PT Duration 6 weeks   PT Treatment/Interventions ADLs/Self Care Home Management;Biofeedback;Cryotherapy;Moist Heat;Gait training;Stair training;Functional mobility training;Therapeutic activities;Therapeutic exercise;Balance training;Neuromuscular re-education;Patient/family education;Manual techniques;Passive range of motion;Energy conservation;Taping   PT Next Visit Plan continue postural training, continue stabilization with progression of quadruped, continue stretching.   Consulted and Agree with Plan of Care Patient      Patient will benefit from skilled therapeutic intervention in order to improve the following deficits and impairments:  Abnormal gait, Improper  body mechanics, Pain, Decreased coordination, Decreased mobility, Increased muscle spasms, Postural dysfunction, Hypomobility, Decreased strength, Decreased balance, Difficulty walking, Impaired flexibility  Visit Diagnosis: Left low back pain, with sciatica presence unspecified  Abnormal posture  Muscle weakness (generalized)  Other symptoms and signs involving  the musculoskeletal system     Problem List Patient Active Problem List   Diagnosis Date Noted  . Pelvic mass in female 03/13/2013    1:48 PM, 12/14/15 Etta Grandchild, PT, DPT Physical Therapist at [place] [phone] (office)     [place] [address] [place], [place], [address] Phone: [phone]   Fax:  [phone]  Name: Kristen Lang MRN: [id number] Date of Birth: [birth year]

## 2015-12-16 ENCOUNTER — Ambulatory Visit (HOSPITAL_COMMUNITY): Payer: BC Managed Care – PPO

## 2015-12-16 DIAGNOSIS — R293 Abnormal posture: Secondary | ICD-10-CM

## 2015-12-16 DIAGNOSIS — M545 Low back pain: Secondary | ICD-10-CM

## 2015-12-16 DIAGNOSIS — M6281 Muscle weakness (generalized): Secondary | ICD-10-CM

## 2015-12-16 DIAGNOSIS — R29898 Other symptoms and signs involving the musculoskeletal system: Secondary | ICD-10-CM

## 2015-12-16 NOTE — Therapy (Signed)
[place], [place], [address] Phone: [phone]   Fax:  [phone]  Physical Therapy Treatment  Patient Details  Name: Kristen Lang MRN: [id number] Date of Birth: 1960-06-15 Referring Provider: Suella Broad   Encounter Date: 12/16/2015      PT End of Session - 12/16/15 1320    Visit Number 6   Number of Visits 12   Date for PT Re-Evaluation 12/24/15   Authorization Type BCBS/Tric[employer]    Authorization Time Period 12/03/15 to 01/14/16   Authorization - Visit Number 6   Authorization - Number of Visits 10   PT Start Time 1304   PT Stop Time 1345   PT Time Calculation (min) 41 min   Activity Tolerance Patient tolerated treatment well   Behavior During Therapy [facility] for tasks assessed/performed      Past Medical History:  Diagnosis Date  . Allergy     Past Surgical History:  Procedure Laterality Date  . ABDOMINAL HYSTERECTOMY      There were no vitals filed for this visit.      Subjective Assessment - 12/16/15 1306    Subjective Pt reports nothing new today. She says shes still doign well overall. She felt good after last session, no complaint of increased pain or symptoms.    Pertinent History no significant PMH    Pain Score 2    Pain Location Back   Pain Orientation Left   Pain Descriptors / Indicators Aching   Pain Type Chronic pain                         [facility] Adult PT Treatment/Exercise - 12/16/15 0001      Exercises   Exercises Lumbar     Lumbar Exercises: Stretches   Active Hamstring Stretch 3 reps;30 seconds   Active Hamstring Stretch Limitations seated in chair   Single Knee to Chest Stretch 2 reps;30 seconds  RLE only; LLE aggravated L-LBP and referral into HS   Lower Trunk Rotation 5 reps;30 seconds;3 reps  bilat, in sidelying   Piriformis Stretch 30 seconds;3 reps  aggravates Sx n supine, but not seated.     Lumbar Exercises: Seated   Other Seated Lumbar  Exercises Sciatic Nerve flossing on LLE  20x, VC, tactile cues     Lumbar Exercises: Supine   Ab Set 20 reps;1 second  with biocuff   Bent Knee Raise 20 reps  with biocuff     Lumbar Exercises: Sidelying   Other Sidelying Lumbar Exercises rotational stretch 3x30sec                  PT Short Term Goals - 12/09/15 1701      PT SHORT TERM GOAL #1   Title Patient to demonstrate ability to maintain correct posture at least 70% of the time and resultant pain no more than 3/10 in order to improve QOL    Time 3   Period Weeks   Status On-going     PT SHORT TERM GOAL #2   Title Patient to report she is experiencing no exacerbation of pain during all functional transfers in order to improve QOL    Time 3   Period Weeks   Status On-going     PT SHORT TERM GOAL #3   Title Patient to demonstrate reduced sensitivity of L piriformis and reduced sciatic nerve related pain in order to improved overall condition and QOL  Time 3   Period Weeks   Status On-going     PT SHORT TERM GOAL #4   Title Patient to be independent in correctly and consistently performing appropriate HEP, to be updated PRN    Period Weeks   Status On-going           PT Long Term Goals - 12/09/15 1702      PT LONG TERM GOAL #1   Title Patient to demonstrate strength 5/5 in all tested muscle groups in order to assist in reducing pain and improving function    Time 6   Period Weeks   Status On-going     PT LONG TERM GOAL #2   Title Patient to be able to ambulate 757ft during 3MWT in order to demonstrate improved mobility for community ambulation and return to work    Time 6   Period Weeks   Status On-going     PT LONG TERM GOAL #3   Title Patient to be able to maintain SLS for at least 30 seconds each LE in order to demonstrate improved balance as well as improved abiltiy to safely return to dynamic tasks    Time 6   Period Weeks   Status On-going     PT LONG TERM GOAL #4   Title Patient to  experience pain no more than 1/10 with no radicular or sciatic symptoms in order to allow her to return to work and sports activities    Time 6   Period Weeks   Status On-going               Plan - 12/16/15 1321    Clinical Impression Statement Pt toleratign session well today. Retested slump test, (+) for neural tension, hence sciatic neural fossing included in session, which adds to reduction in symptoms. Core stability in supine is compined with biocuff which is very revealing about continued lack of stabilization of core. Other exercises are continued as prevoisuly, a few noted to have less aggravation when performed in sitting v supine. Pt reports continued reduction in symptoms overall, and is making progress toward goals.    Rehab Potential Good   PT Frequency 2x / week   PT Duration 6 weeks   PT Treatment/Interventions ADLs/Self Care Home Management;Biofeedback;Cryotherapy;Moist Heat;Gait training;Stair training;Functional mobility training;Therapeutic activities;Therapeutic exercise;Balance training;Neuromuscular re-education;Patient/family education;Manual techniques;Passive range of motion;Energy conservation;Taping   PT Next Visit Plan continue postural training, continue stabilization with progression of quadruped, continue stretching.   PT Home Exercise Plan Piriformis stretch, hamstring stretch, and squats; added in LLE sciatic flossing on 8/9   Consulted and Agree with Plan of Care Patient      Patient will benefit from skilled therapeutic intervention in order to improve the following deficits and impairments:  Abnormal gait, Improper body mechanics, Pain, Decreased coordination, Decreased mobility, Increased muscle spasms, Postural dysfunction, Hypomobility, Decreased strength, Decreased balance, Difficulty walking, Impaired flexibility  Visit Diagnosis: Left low back pain, with sciatica presence unspecified  Abnormal posture  Muscle weakness (generalized)  Other  symptoms and signs involving the musculoskeletal system     Problem List Patient Active Problem List   Diagnosis Date Noted  . Pelvic mass in female 03/13/2013    1:49 PM, 12/16/15 Etta Grandchild, PT, DPT Physical Therapist at [facility] [phone] (office)     [facility] [address] [place], [place], [address] Phone: [phone]   Fax:  [phone]  Name: Aarshi Prechtl MRN:  [id number] Date of Birth: [birth year]

## 2015-12-21 ENCOUNTER — Ambulatory Visit (HOSPITAL_COMMUNITY): Payer: BC Managed Care – PPO | Admitting: Physical Therapy

## 2015-12-21 DIAGNOSIS — M545 Low back pain: Secondary | ICD-10-CM | POA: Diagnosis not present

## 2015-12-21 DIAGNOSIS — M6281 Muscle weakness (generalized): Secondary | ICD-10-CM

## 2015-12-21 DIAGNOSIS — R29898 Other symptoms and signs involving the musculoskeletal system: Secondary | ICD-10-CM

## 2015-12-21 DIAGNOSIS — R293 Abnormal posture: Secondary | ICD-10-CM

## 2015-12-21 NOTE — Patient Instructions (Addendum)
Straight Leg Raise (Prone)    Abdomen and head supported, keep left knee locked and raise leg at hip. Avoid arching low back. Repeat _10___ times per set. Do __1__ sets per session. Do _2___ sessions per day.  http://orth.exer.us/1112   Copyright  VHI. All rights reserved.  Opposite Arm / Leg Lift (Prone)    Abdomen and head supported, left knee locked, raise leg and opposite arm _2___ inches from floor. Repeat _10___ times per set. Do ___1_ sets per session. Do ____ sessions per day. 2 http://orth.exer.us/1114   Copyright  VHI. All rights reserved.  Angry Cat Stretch    Tuck chin and tighten stomach, arching back. Repeat ___5_ times per set. Do __4__ sets per session. Do ___2_ sessions per day.  http://orth.exer.us/118   Copyright  VHI. All rights reserved.  Hip Extension (All-Fours)    Lift right leg back with knee slightly flexed. Do not arch neck or back. Repeat __10__ times per set. Do _1___ sets per session. Do __2__ sessions per day.  http://orth.exer.us/106   Copyright  VHI. All rights reserved.

## 2015-12-21 NOTE — Therapy (Signed)
[facility] [address] [place], [place], [address] Phone: [phone]   Fax:  [phone]  Physical Therapy Treatment  Patient Details  Name: Kristen Lang MRN: [id number] Date of Birth: December 15, 1960 Referring Provider: Suella Broad   Encounter Date: 12/21/2015      PT End of Session - 12/21/15 1321    Visit Number 7   Number of Visits 12   Date for PT Re-Evaluation 12/24/15   Authorization Type BCBS/T[employer]    Authorization Time Period 12/03/15 to 01/14/16   Authorization - Visit Number 7   Authorization - Number of Visits 10   PT Start Time 1300   PT Stop Time 1340   PT Time Calculation (min) 40 min   Activity Tolerance Patient tolerated treatment well   Behavior During Therapy [facility] for tasks assessed/performed      Past Medical History:  Diagnosis Date  . Allergy     Past Surgical History:  Procedure Laterality Date  . ABDOMINAL HYSTERECTOMY      There were no vitals filed for this visit.      Subjective Assessment - 12/21/15 1301    Subjective Pt states that she is doing the exercises everyday at home and is painfree.   Pertinent History no significant PMH    How long can you sit comfortably? 7/27- no limit but it is painful, she has to stand up/move around to reduce pain   How long can you stand comfortably? 7/27- 20 minutes    How long can you walk comfortably? 7/27- no consistent pain when walking, favoring L LE    Diagnostic tests CT done 11/09/15: L4-L5 anterolisthesis, spinal stenosis, L L4-L5 nerve impingement, chronic lumbar degenerative disease    Patient Stated Goals get rid of pain, get moving better, get back to work    Currently in Pain? No/denies   Pain Onset 1 to 4 weeks ago                         [facility] Adult PT Treatment/Exercise - 12/21/15 0001      Exercises   Exercises Lumbar     Lumbar Exercises: Stretches   Active Hamstring Stretch 3 reps;30 seconds   Active Hamstring  Stretch Limitations supine    Single Knee to Chest Stretch 2 reps;30 seconds  RLE only; LLE aggravated L-LBP and referral into HS   Lower Trunk Rotation 5 reps;30 seconds;3 reps  bilat, in sidelying   Pelvic Tilt Limitations 10   Piriformis Stretch 2 reps;30 seconds  aggravates Sx n supine, but not seated.     Lumbar Exercises: Standing   Heel Raises 10 reps   Functional Squats 10 reps   Other Standing Lumbar Exercises lumbar excursion x 3    Other Standing Lumbar Exercises SLS x 5      Lumbar Exercises: Seated   Other Seated Lumbar Exercises --  20x, VC, tactile cues     Lumbar Exercises: Supine   Ab Set --  with biocuff   Bent Knee Raise --  with biocuff   Bridge 10 reps   Straight Leg Raise 10 reps     Lumbar Exercises: Sidelying   Other Sidelying Lumbar Exercises --     Lumbar Exercises: Prone   Opposite Arm/Leg Raise Right arm/Left leg;Left arm/Right leg;10 reps     Lumbar Exercises: Quadruped   Madcat/Old Horse 5 reps   Straight Leg Raise 10 reps  PT Education - 12/21/15 1321    Education provided Yes   Education Details advance HEP   Person(s) Educated Patient   Comprehension Verbalized understanding          PT Short Term Goals - 12/21/15 1329      PT SHORT TERM GOAL #1   Title Patient to demonstrate ability to maintain correct posture at least 70% of the time and resultant pain no more than 3/10 in order to improve QOL    Time 3   Period Weeks   Status Achieved     PT SHORT TERM GOAL #2   Title Patient to report she is experiencing no exacerbation of pain during all functional transfers in order to improve QOL    Time 3   Period Weeks   Status Achieved     PT SHORT TERM GOAL #3   Title Patient to demonstrate reduced sensitivity of L piriformis and reduced sciatic nerve related pain in order to improved overall condition and QOL    Time 3   Period Weeks   Status Achieved     PT SHORT TERM GOAL #4   Title Patient to be  independent in correctly and consistently performing appropriate HEP, to be updated PRN    Period Weeks   Status Achieved           PT Long Term Goals - 12/21/15 1330      PT LONG TERM GOAL #1   Title Patient to demonstrate strength 5/5 in all tested muscle groups in order to assist in reducing pain and improving function    Time 6   Period Weeks   Status On-going     PT LONG TERM GOAL #2   Title Patient to be able to ambulate 756ft during 3MWT in order to demonstrate improved mobility for community ambulation and return to work    Time 6   Period Weeks   Status On-going     PT LONG TERM GOAL #3   Title Patient to be able to maintain SLS for at least 30 seconds each LE in order to demonstrate improved balance as well as improved abiltiy to safely return to dynamic tasks    Time 6   Period Weeks   Status On-going     PT LONG TERM GOAL #4   Title Patient to experience pain no more than 1/10 with no radicular or sciatic symptoms in order to allow her to return to work and sports activities    Time 6   Period Weeks   Status Achieved               Plan - 12/21/15 1321    Clinical Impression Statement Pt given advanced HEP..  Added prone opposite leg/arm , supine SLR and quadriped mad cat stretch with good form with verbal cuing.  All exercises done with therapist facilitation .    Rehab Potential Good   PT Frequency 2x / week   PT Duration 6 weeks   PT Treatment/Interventions ADLs/Self Care Home Management;Biofeedback;Cryotherapy;Moist Heat;Gait training;Stair training;Functional mobility training;Therapeutic activities;Therapeutic exercise;Balance training;Neuromuscular re-education;Patient/family education;Manual techniques;Passive range of motion;Energy conservation;Taping   PT Next Visit Plan begin lunging and proper lifting    PT Home Exercise Plan Piriformis stretch, hamstring stretch, and squats; added in LLE sciatic flossing on 8/9   Consulted and Agree with  Plan of Care Patient      Patient will benefit from skilled therapeutic intervention in order to improve the following deficits and impairments:  Abnormal gait, Improper body mechanics, Pain, Decreased coordination, Decreased mobility, Increased muscle spasms, Postural dysfunction, Hypomobility, Decreased strength, Decreased balance, Difficulty walking, Impaired flexibility  Visit Diagnosis: Left low back pain, with sciatica presence unspecified  Abnormal posture  Muscle weakness (generalized)  Other symptoms and signs involving the musculoskeletal system     Problem List Patient Active Problem List   Diagnosis Date Noted  . Pelvic mass in female 03/13/2013    Rayetta Humphrey, PT CLT [phone] 12/21/2015, 1:46 PM  [facility] [address] [place], [place], [address] Phone: [phone]   Fax:  [phone]  Name: Birgit Bowlan MRN: [id number] Date of Birth: 03/10/1961

## 2015-12-23 ENCOUNTER — Ambulatory Visit (HOSPITAL_COMMUNITY): Payer: BC Managed Care – PPO | Admitting: Physical Therapy

## 2015-12-23 DIAGNOSIS — M545 Low back pain: Secondary | ICD-10-CM

## 2015-12-23 DIAGNOSIS — R29898 Other symptoms and signs involving the musculoskeletal system: Secondary | ICD-10-CM

## 2015-12-23 DIAGNOSIS — M6281 Muscle weakness (generalized): Secondary | ICD-10-CM

## 2015-12-23 DIAGNOSIS — R293 Abnormal posture: Secondary | ICD-10-CM

## 2015-12-23 NOTE — Therapy (Signed)
[place], [place], [address] Phone: [phone]   Fax:  [phone]  Physical Therapy Treatment/Reassessment   Patient Details  Name: Kristen Lang MRN: [id number] Date of Birth: [birth year] Referring Provider: Suella Broad, MD  Encounter Date: 12/23/2015      PT End of Session - 12/23/15 1600    Visit Number 8   Number of Visits 12   Date for PT Re-Evaluation 12/24/15   Authorization Type BCBS/Tricare    Authorization Time Period 12/03/15 to 01/14/16   Authorization - Visit Number 8   Authorization - Number of Visits 10   PT Start Time 1520   PT Stop Time 1558   PT Time Calculation (min) 38 min   Activity Tolerance Patient tolerated treatment well   Behavior During Therapy [facility] for tasks assessed/performed      Past Medical History:  Diagnosis Date  . Allergy     Past Surgical History:  Procedure Laterality Date  . ABDOMINAL HYSTERECTOMY      There were no vitals filed for this visit.      Subjective Assessment - 12/23/15 1524    Subjective Pt feels things are getting better since beginning PT. She does have pain and stiffness when she wakes up, but this always improves once she starts moving. She feels she is about 75% improved and is performing her HEP regularly. She is cleared to return to work on the 28th of this month.   Pertinent History no significant PMH    How long can you sit comfortably? 7/27- no limit but it is painful, she has to stand up/move around to reduce pain; 12/23/15: same   How long can you stand comfortably? 7/27- 20 minutes; 12/23/15: not sure but she feels it has improved   How long can you walk comfortably? 7/27- no consistent pain when walking, favoring L LE; 12/23/15: unlimited   Diagnostic tests CT done 11/09/15: L4-L5 anterolisthesis, spinal stenosis, L L4-L5 nerve impingement, chronic lumbar degenerative disease    Patient Stated Goals get rid of pain, get moving better,  get back to work    Currently in Pain? No/denies   Pain Onset 1 to 4 weeks ago            [facility] PT Assessment - 12/23/15 0001      Assessment   Medical Diagnosis degeneration lumbar disc    Referring Provider Suella Broad, MD   Onset Date/Surgical Date 11/09/15   Next MD Visit Dr. Nelva Bush August 12th      Balance Screen   Has the patient fallen in the past 6 months No   Has the patient had a decrease in activity level because of a fear of falling?  No   Is the patient reluctant to leave their home because of a fear of falling?  No     Prior Function   Level of Independence Independent;Independent with basic ADLs;Independent with gait;Independent with transfers   Vocation Full time employment   Oceanographer and custodial work    Leisure bowling, softball, motorcycle riding      Observation/Other Assessments   Observations --   Focus on Therapeutic Outcomes (FOTO)  26% limited      Posture/Postural Control   Posture/Postural Control Postural limitations   Postural Limitations --   Posture Comments excessive lumbar lordosis      AROM   Overall AROM Comments R hip ROM WFL; L hip ROM limited by muscle guarding/pain  Lumbar Flexion 65   Lumbar Extension --  DNT due to anterolisthesis    Lumbar - Right Side Bend WFL    Lumbar - Left [facility]      Strength   Right Hip Flexion 5/5   Right Hip Extension 4/5   Right Hip ABduction 5/5   Left Hip Flexion 5/5   Left Hip Extension 4/5   Left Hip ABduction 4+/5   Right Knee Flexion 5/5   Right Knee Extension 5/5   Left Knee Flexion 5/5   Left Knee Extension 5/5   Right Ankle Dorsiflexion 5/5   Left Ankle Dorsiflexion 5/5     Flexibility   Hamstrings mild limitation    Piriformis muscle guarding, pain with limited stretch      Palpation   Palpation comment Lt piriformis/gluteal region     Ambulation/Gait   Gait Comments proximal muscle weakness     6 minute walk test results     Aerobic Endurance Distance Walked 858   Endurance additional comments 3MWT      Timed Up and Go Test   Normal TUG (seconds) 10.43                     [facility] Adult PT Treatment/Exercise - 12/23/15 0001      Therapeutic Activites    Therapeutic Activities Lifting   Lifting medium box from floor to stand x5 reps                PT Education - 12/23/15 1558    Education provided Yes   Education Details discussed reassessment findings/POC; lifting mechanics   Person(s) Educated Patient   Methods Explanation;Demonstration   Comprehension Verbalized understanding;Returned demonstration          PT Short Term Goals - 12/23/15 1547      PT SHORT TERM GOAL #1   Title Patient to demonstrate ability to maintain correct posture at least 70% of the time and resultant pain no more than 3/10 in order to improve QOL    Time 3   Period Weeks   Status Achieved     PT SHORT TERM GOAL #2   Title Patient to report she is experiencing no exacerbation of pain during all functional transfers in order to improve QOL    Time 3   Period Weeks   Status Achieved     PT SHORT TERM GOAL #3   Title Patient to demonstrate reduced sensitivity of L piriformis and reduced sciatic nerve related pain in order to improved overall condition and QOL    Time 3   Period Weeks   Status Achieved     PT SHORT TERM GOAL #4   Title Patient to be independent in correctly and consistently performing appropriate HEP, to be updated PRN    Period Weeks   Status Achieved           PT Long Term Goals - 12/23/15 1548      PT LONG TERM GOAL #1   Title Patient to demonstrate strength 5/5 in all tested muscle groups in order to assist in reducing pain and improving function    Baseline left hip extension remains slightly limited   Time 6   Period Weeks   Status Partially Met     PT LONG TERM GOAL #2   Title Patient to be able to ambulate 771f during 3MWT in order to demonstrate improved  mobility for community ambulation and return to work  Baseline 858 ft   Time 6   Period Weeks   Status Achieved     PT LONG TERM GOAL #3   Title Patient to be able to maintain SLS for at least 30 seconds each LE in order to demonstrate improved balance as well as improved abiltiy to safely return to dynamic tasks    Baseline SLS: up to 30 sec on LLE, x2 trials    Time 6   Period Weeks   Status Achieved     PT LONG TERM GOAL #4   Title Patient to experience pain no more than 1/10 with no radicular or sciatic symptoms in order to allow her to return to work and sports activities    Baseline 3/10 max pain    Time 6   Period Weeks   Status Achieved               Plan - 12/23/15 1600    Clinical Impression Statement Pt was reassessed this visit having met several short and long term goals. She continues to demonstrate minimal weakness in Lt hip abductors and extensors however she is overall reporting improved pain report and activity tolerance. She went back to her referring physician last week who has cleared her to return to work at the end of August. Ended session with review of correct lifting mechanics with pt able to return correct demonstration with minimal cues. Will anticipate d/c next week with advanced HEP assuming no issues arise.   Rehab Potential Good   PT Frequency 2x / week   PT Duration 6 weeks   PT Treatment/Interventions ADLs/Self Care Home Management;Biofeedback;Cryotherapy;Moist Heat;Gait training;Stair training;Functional mobility training;Therapeutic activities;Therapeutic exercise;Balance training;Neuromuscular re-education;Patient/family education;Manual techniques;Passive range of motion;Energy conservation;Taping   PT Next Visit Plan progress functional strength of hip ext/abd (Lt>Rt), begin to finalize HEP for d/c   PT Home Exercise Plan Piriformis stretch, hamstring stretch, and squats; added in LLE sciatic flossing on 8/9   Consulted and Agree with Plan  of Care Patient      Patient will benefit from skilled therapeutic intervention in order to improve the following deficits and impairments:  Abnormal gait, Improper body mechanics, Pain, Decreased coordination, Decreased mobility, Increased muscle spasms, Postural dysfunction, Hypomobility, Decreased strength, Decreased balance, Difficulty walking, Impaired flexibility  Visit Diagnosis: Left low back pain, with sciatica presence unspecified  Abnormal posture  Other symptoms and signs involving the musculoskeletal system  Muscle weakness (generalized)     Problem List Patient Active Problem List   Diagnosis Date Noted  . Pelvic mass in female 03/13/2013   4:04 PM,12/23/15 Elly Modena PT, DPT [facility] [place], [place], [address] Phone: [phone]   Fax:  [phone]  Name: Dawnita Molner MRN: [id number] Date of Birth: 1960/10/11

## 2015-12-28 ENCOUNTER — Encounter (HOSPITAL_COMMUNITY): Payer: Self-pay

## 2015-12-28 ENCOUNTER — Ambulatory Visit (HOSPITAL_COMMUNITY): Payer: BC Managed Care – PPO

## 2015-12-28 DIAGNOSIS — R29898 Other symptoms and signs involving the musculoskeletal system: Secondary | ICD-10-CM

## 2015-12-28 DIAGNOSIS — R293 Abnormal posture: Secondary | ICD-10-CM

## 2015-12-28 DIAGNOSIS — M6281 Muscle weakness (generalized): Secondary | ICD-10-CM

## 2015-12-28 DIAGNOSIS — M545 Low back pain: Secondary | ICD-10-CM

## 2015-12-28 NOTE — Therapy (Signed)
[facility] [place], [place], [address] Phone: [phone]   Fax:  [phone]  Physical Therapy Treatment  Patient Details  Name: Kristen Lang MRN: [id number] Date of Birth: 1960/10/03 Referring Provider: Suella Broad, MD  Encounter Date: 12/28/2015      PT End of Session - 12/28/15 1342    Visit Number 9   Number of Visits 12   Date for PT Re-Evaluation 12/24/15   Authorization Type BCBS/[employer]    Authorization Time Period 12/03/15 to 01/14/16   Authorization - Visit Number 9   Authorization - Number of Visits 10   PT Start Time 1310   PT Stop Time 1345   PT Time Calculation (min) 35 min   Activity Tolerance Patient tolerated treatment well   Behavior During Therapy [facility] for tasks assessed/performed      Past Medical History:  Diagnosis Date  . Allergy     Past Surgical History:  Procedure Laterality Date  . ABDOMINAL HYSTERECTOMY      There were no vitals filed for this visit.      Subjective Assessment - 12/28/15 1312    Subjective Pt reports things are going well. She was able to push mow her lawn on Saturday for ~2 hours and felt mostly fine afterward.    Pertinent History no significant PMH    Currently in Pain? No/denies                         [facility] Adult PT Treatment/Exercise - 12/28/15 0001      Lumbar Exercises: Seated   Long Arc Quad on [place] 20 reps;Other (comment)   LAQ on Ball Weights (lbs) with stabilization cuff at lordosis   Hip Flexion on Ball 15 reps;Both  with stabilization cuff at lordosis   Sit to Stand 20 reps  c PVC for spine feedback   Sit to Stand Limitations 10x  c PVC for spine feedback   Other Seated Lumbar Exercises STS simulated load with volleyball 1x10  STS simulated load with weighted ball1x10                  PT Short Term Goals - 12/23/15 1547      PT SHORT TERM GOAL #1   Title Patient to demonstrate ability to maintain  correct posture at least 70% of the time and resultant pain no more than 3/10 in order to improve QOL    Time 3   Period Weeks   Status Achieved     PT SHORT TERM GOAL #2   Title Patient to report she is experiencing no exacerbation of pain during all functional transfers in order to improve QOL    Time 3   Period Weeks   Status Achieved     PT SHORT TERM GOAL #3   Title Patient to demonstrate reduced sensitivity of L piriformis and reduced sciatic nerve related pain in order to improved overall condition and QOL    Time 3   Period Weeks   Status Achieved     PT SHORT TERM GOAL #4   Title Patient to be independent in correctly and consistently performing appropriate HEP, to be updated PRN    Period Weeks   Status Achieved           PT Long Term Goals - 12/23/15 1548      PT LONG TERM GOAL #1   Title Patient to demonstrate strength 5/5 in all  tested muscle groups in order to assist in reducing pain and improving function    Baseline left hip extension remains slightly limited   Time 6   Period Weeks   Status Partially Met     PT LONG TERM GOAL #2   Title Patient to be able to ambulate 788f during 3MWT in order to demonstrate improved mobility for community ambulation and return to work    Baseline 858 ft   Time 6   Period Weeks   Status Achieved     PT LONG TERM GOAL #3   Title Patient to be able to maintain SLS for at least 30 seconds each LE in order to demonstrate improved balance as well as improved abiltiy to safely return to dynamic tasks    Baseline SLS: up to 30 sec on LLE, x2 trials    Time 6   Period Weeks   Status Achieved     PT LONG TERM GOAL #4   Title Patient to experience pain no more than 1/10 with no radicular or sciatic symptoms in order to allow her to return to work and sports activities    Baseline 3/10 max pain    Time 6   Period Weeks   Status Achieved               Plan - 12/28/15 1345    Clinical Impression Statement Pt  toleratng sessionw ell, with continued focus on ergonomic lifting with stablized core and simulated work tasks. Many of today's exercises would be ideal for advanced HEP next session.    Rehab Potential Good   PT Frequency 2x / week   PT Duration 6 weeks   PT Treatment/Interventions ADLs/Self Care Home Management;Biofeedback;Cryotherapy;Moist Heat;Gait training;Stair training;Functional mobility training;Therapeutic activities;Therapeutic exercise;Balance training;Neuromuscular re-education;Patient/family education;Manual techniques;Passive range of motion;Energy conservation;Taping   PT Next Visit Plan progress functional strength of hip ext/abd (Lt>Rt), begin to finalize HEP for d/c.    Consulted and Agree with Plan of Care Patient      Patient will benefit from skilled therapeutic intervention in order to improve the following deficits and impairments:  Abnormal gait, Improper body mechanics, Pain, Decreased coordination, Decreased mobility, Increased muscle spasms, Postural dysfunction, Hypomobility, Decreased strength, Decreased balance, Difficulty walking, Impaired flexibility  Visit Diagnosis: Left low back pain, with sciatica presence unspecified  Abnormal posture  Other symptoms and signs involving the musculoskeletal system  Muscle weakness (generalized)     Problem List Patient Active Problem List   Diagnosis Date Noted  . Pelvic mass in female 03/13/2013   3:17 PM, 12/28/15 AEtta Grandchild PT, DPT Physical Therapist at [phone](office)     [place] [address]Phone: [phone]  Fax:  [phone] Name: Kristen EncaladeMRN: [id number] of Birth: 101/01/1961

## 2015-12-30 ENCOUNTER — Ambulatory Visit (HOSPITAL_COMMUNITY): Payer: BC Managed Care – PPO

## 2015-12-30 DIAGNOSIS — R293 Abnormal posture: Secondary | ICD-10-CM

## 2015-12-30 DIAGNOSIS — R29898 Other symptoms and signs involving the musculoskeletal system: Secondary | ICD-10-CM

## 2015-12-30 DIAGNOSIS — M545 Low back pain: Secondary | ICD-10-CM

## 2015-12-30 DIAGNOSIS — M6281 Muscle weakness (generalized): Secondary | ICD-10-CM

## 2015-12-30 NOTE — Therapy (Signed)
PHYSICAL THERAPY DISCHARGE SUMMARY  Visits from Start of Care: 10  Current functional level related to goals / functional outcomes: *see below    Remaining deficits: *see below    Education / Equipment: *see below  Plan: Patient agrees to discharge.  Patient goals were met. Patient is being discharged due to meeting the stated rehab goals.  ?????         2:30 PM, 12/30/15 Etta Grandchild, PT, DPT Physical Therapist at [place] [phone] (office)      [place] [address] [place], [place], [address] Phone: [phone]   Fax:  [phone]  Physical Therapy Treatment  Patient Details  Name: Kristen Lang MRN: [id number] Date of Birth: Oct 01, 1960 Referring Provider: Suella Broad, MD  Encounter Date: 12/30/2015      PT End of Session - 12/30/15 1418    Visit Number 10   Number of Visits 12   Date for PT Re-Evaluation 12/24/15   Authorization Type [employer]BS/[employer]    Authorization Time Period 12/03/15 to 01/14/16   Authorization - Visit Number 10   Authorization - Number of Visits 10   PT Start Time 5885   PT Stop Time 1424   PT Time Calculation (min) 38 min   Activity Tolerance Patient tolerated treatment well;No increased pain   Behavior During Therapy [facility] for tasks assessed/performed      Past Medical History:  Diagnosis Date  . Allergy     Past Surgical History:  Procedure Laterality Date  . ABDOMINAL HYSTERECTOMY      There were no vitals filed for this visit.      Subjective Assessment - 12/30/15 1354    Subjective Pt doing well. She is ready to get back to work and feels confident in her ability to perfrom job related activity with exacerbation of pain.    Pertinent History no significant PMH    How long can you sit comfortably? 7/27- no limit but it is painful, she has to stand up/move around to reduce pain; 12/23/15: same   How long can you stand comfortably?  7/27- 20 minutes; 12/23/15: not sure but she feels it has improved   How long can you walk comfortably? 7/27- no consistent pain when walking, favoring L LE; 12/23/15: unlimited   Diagnostic tests CT done 11/09/15: L4-L5 anterolisthesis, spinal stenosis, L L4-L5 nerve impingement, chronic lumbar degenerative disease    Patient Stated Goals get rid of pain, get moving better, get back to work    Currently in Pain? No/denies                         [facility] Adult PT Treatment/Exercise - 12/30/15 0001      Posture/Postural Control   Posture/Postural Control Postural limitations   Posture Comments excessive lumbar lordosis      Therapeutic Activites    Therapeutic Activities Lifting   Lifting SEated trunk stabilization with [place] cuff  Hip flexion, LAQ, overhead lift 6x60sec     Exercises   Exercises Lumbar     Lumbar Exercises: Standing   Other Standing Lumbar Exercises deadlifts/ RDL: over head, front loaded, load free, + row  4x15     Lumbar Exercises: Seated   Sit to Stand 20 reps  2x10 c PVC for spine feedback   Other Seated Lumbar Exercises Standing Deadlift 2x10  PT Short Term Goals - 12/30/15 1426      PT SHORT TERM GOAL #1   Title Patient to demonstrate ability to maintain correct posture at least 70% of the time and resultant pain no more than 3/10 in order to improve QOL    Time 3   Period Weeks   Status Achieved     PT SHORT TERM GOAL #2   Title Patient to report she is experiencing no exacerbation of pain during all functional transfers in order to improve QOL    Time 3   Period Weeks   Status Achieved     PT SHORT TERM GOAL #3   Title Patient to demonstrate reduced sensitivity of L piriformis and reduced sciatic nerve related pain in order to improved overall condition and QOL    Time 3   Period Weeks   Status Achieved     PT SHORT TERM GOAL #4   Title Patient to be independent in correctly and consistently  performing appropriate HEP, to be updated PRN    Period Weeks   Status Achieved           PT Long Term Goals - 12/30/15 1426      PT LONG TERM GOAL #1   Title Patient to demonstrate strength 5/5 in all tested muscle groups in order to assist in reducing pain and improving function    Baseline left hip extension remains slightly limited   Time 6   Period Weeks   Status Partially Met     PT LONG TERM GOAL #2   Title Patient to be able to ambulate 762f during 3MWT in order to demonstrate improved mobility for community ambulation and return to work    Baseline 858 ft   Time 6   Period Weeks   Status Achieved     PT LONG TERM GOAL #3   Title Patient to be able to maintain SLS for at least 30 seconds each LE in order to demonstrate improved balance as well as improved abiltiy to safely return to dynamic tasks    Baseline SLS: up to 30 sec on LLE, x2 trials    Time 6   Period Weeks   Status Achieved     PT LONG TERM GOAL #4   Title Patient to experience pain no more than 1/10 with no radicular or sciatic symptoms in order to allow her to return to work and sports activities    Baseline 3/10 max pain    Time 6   Period Weeks   Status Achieved               Plan - 12/30/15 1423    Clinical Impression Statement Pt prepared and ready for DC this session with updated advanced HEP; extensive time is spent training on new HEP activities, squats adn deadlifts, with extensive tiem spent performing and teaching in multiple segments adn postures to improve body trunk awareness. Pt is sweating at end of session, but is pain free.    PT Treatment/Interventions ADLs/Self Care Home Management;Biofeedback;Cryotherapy;Moist Heat;Gait training;Stair training;Functional mobility training;Therapeutic activities;Therapeutic exercise;Balance training;Neuromuscular re-education;Patient/family education;Manual techniques;Passive range of motion;Energy conservation;Taping   PT Home Exercise Plan  Squats, Deadlifts    Consulted and Agree with Plan of Care Patient      Patient will benefit from skilled therapeutic intervention in order to improve the following deficits and impairments:  Abnormal gait, Improper body mechanics, Pain, Decreased coordination, Decreased mobility, Increased muscle spasms, Postural dysfunction, Hypomobility, Decreased strength, Decreased  balance, Difficulty walking, Impaired flexibility  Visit Diagnosis: Left low back pain, with sciatica presence unspecified  Abnormal posture  Other symptoms and signs involving the musculoskeletal system  Muscle weakness (generalized)     Problem List Patient Active Problem List   Diagnosis Date Noted  . Pelvic mass in female 03/13/2013    2:29 PM, 12/30/15 Etta Grandchild, PT, DPT Physical Therapist at [place] [phone] (office)     [place], [place], [address] Phone: [phone]   Fax:  [phone]  Name: Kristen Lang MRN: [id number] Date of Birth: 21-May-1960

## 2016-01-04 ENCOUNTER — Encounter (HOSPITAL_COMMUNITY): Payer: BC Managed Care – PPO

## 2016-01-06 ENCOUNTER — Encounter (HOSPITAL_COMMUNITY): Payer: BC Managed Care – PPO | Admitting: Physical Therapy

## 2016-10-31 ENCOUNTER — Other Ambulatory Visit (HOSPITAL_COMMUNITY): Payer: Self-pay | Admitting: Family

## 2016-10-31 DIAGNOSIS — Z1231 Encounter for screening mammogram for malignant neoplasm of breast: Secondary | ICD-10-CM

## 2016-11-01 ENCOUNTER — Encounter (INDEPENDENT_AMBULATORY_CARE_PROVIDER_SITE_OTHER): Payer: Self-pay | Admitting: *Deleted

## 2016-11-14 ENCOUNTER — Ambulatory Visit (HOSPITAL_COMMUNITY)
Admission: RE | Admit: 2016-11-14 | Discharge: 2016-11-14 | Disposition: A | Discharge status: Home / Self Care | Source: Ambulatory Visit | Attending: Family | Admitting: Family

## 2016-11-14 DIAGNOSIS — Z1231 Encounter for screening mammogram for malignant neoplasm of breast: Secondary | ICD-10-CM | POA: Insufficient documentation

## 2016-11-22 ENCOUNTER — Ambulatory Visit (HOSPITAL_COMMUNITY): Attending: Orthopedic Surgery | Admitting: Physical Therapy

## 2016-11-22 DIAGNOSIS — R29898 Other symptoms and signs involving the musculoskeletal system: Secondary | ICD-10-CM | POA: Diagnosis present

## 2016-11-22 DIAGNOSIS — M79601 Pain in right arm: Secondary | ICD-10-CM | POA: Insufficient documentation

## 2016-11-22 DIAGNOSIS — M6281 Muscle weakness (generalized): Secondary | ICD-10-CM | POA: Diagnosis present

## 2016-11-22 DIAGNOSIS — R293 Abnormal posture: Secondary | ICD-10-CM | POA: Diagnosis present

## 2016-11-22 NOTE — Therapy (Signed)
[place], [place], [address] Phone: [phone]   Fax:  [phone]  Physical Therapy Evaluation  Patient Details  Name: Kristen Lang MRN: [id number] Date of Birth: 1961-02-08 Referring Provider: Jenetta Loges  Encounter Date: 11/22/2016      PT End of Session - 11/22/16 1208    Visit Number 1   Number of Visits 8   Date for PT Re-Evaluation 12/22/16   Authorization Type [employer]   Authorization - Visit Number 1   Authorization - Number of Visits 8   PT Start Time 1122   PT Stop Time 1206   PT Time Calculation (min) 44 min   Activity Tolerance Patient tolerated treatment well   Behavior During Therapy [facility] for tasks assessed/performed      Past Medical History:  Diagnosis Date  . Allergy     Past Surgical History:  Procedure Laterality Date  . ABDOMINAL HYSTERECTOMY      There were no vitals filed for this visit.       Subjective Assessment - 11/22/16 1122    Subjective Kristen Lang states that she has been having  right arm that radiates  to her hand; she denies cervical pain.    The pain is constant and has been going on for three months.  She has not tried anything besides going to the MD who referred her to therapy.     Pertinent History hx of radicular back pain.   Limitations Reading;Lifting;Writing;House hold activities   Patient Stated Goals less pain; to be able to complete cleaning without increased pain.     Currently in Pain? Yes   Pain Score 8    Pain Location Arm   Pain Orientation Right   Pain Descriptors / Indicators Aching   Pain Type Chronic pain   Pain Onset More than a month ago   Pain Frequency Constant   Aggravating Factors  cleaning    Pain Relieving Factors holding arm above her head    Effect of Pain on Daily Activities increases             [facility] PT Assessment - 11/22/16 0001      Assessment   Medical Diagnosis cervical radiculopathy   Referring Provider  Jenetta Loges   Onset Date/Surgical Date 08/21/16   Hand Dominance Right   Next MD Visit 12/20/2016   Prior Therapy not for cervical area     Precautions   Precautions None     Restrictions   Weight Bearing Restrictions No     Balance Screen   Has the patient fallen in the past 6 months No   Has the patient had a decrease in activity level because of a fear of falling?  No   Is the patient reluctant to leave their home because of a fear of falling?  No     Prior Function   Level of Independence Independent   Vocation Part time employment   Vocation Requirements cleaning   Leisure riding motorcycle      Cognition   Overall Cognitive Status Within Functional Limits for tasks assessed     Observation/Other Assessments   Focus on Therapeutic Outcomes (FOTO)  58     Posture/Postural Control   Posture/Postural Control No significant limitations     ROM / Strength   AROM / PROM / Strength AROM;Strength     AROM   Overall AROM Comments Shoulder ROM wnl    AROM Assessment Site Cervical  Cervical Extension functional with increased pain with reps    Cervical - Right Side Bend 25 reps no change    Cervical - Left Side Bend 40 reps improve pain    Cervical - Right Rotation 60   Cervical - Left Rotation 70     Strength   Overall Strength Comments cervical extension 3+/5 all others functional    Strength Assessment Site Shoulder;Cervical   Right/Left Shoulder Right   Right Shoulder Flexion 4/5   Right Shoulder ABduction 4/5   Right Shoulder Internal Rotation 5/5   Right Shoulder External Rotation 4+/5   Cervical Extension 3+/5     Palpation   Palpation comment no spasms but some tightness and tenderness in Rt supraspinatus mm.            Objective measurements completed on examination: See above findings.          [facility] Adult PT Treatment/Exercise - 11/22/16 0001      Exercises   Exercises Neck     Neck Exercises: Standing   Neck Retraction 5 reps    Other Standing Exercises scapular retraction x 5      Neck Exercises: Seated   Cervical Isometrics Extension;5 reps     Manual Therapy   Manual Therapy Manual Traction   Manual therapy comments done seperate from all other aspects of treatment.    Manual Traction supine                  PT Education - 11/22/16 1207    Education provided Yes   Education Details The importance of keeping good posture while performing functional activity; HEP   Person(s) Educated Patient   Methods Explanation   Comprehension Verbalized understanding          PT Short Term Goals - 11/22/16 1225      PT SHORT TERM GOAL #1   Title Pt to have no radicular sx past her right elbow to demonstrate decreased nerve irritation.    Time 2   Period Weeks   Status New     PT SHORT TERM GOAL #2   Title Pt pain level to be no greater than a 5/10 to allow pt to complete at least an hour of housecleaning without increased pain.     Time 2   Period Weeks   Status New     PT SHORT TERM GOAL #3   Title Pt to cervcial ROM to be wnl to allow safe driving    Time 2   Period Weeks           PT Long Term Goals - 11/22/16 1225      PT LONG TERM GOAL #1   Title Pt to have had no radicular sx  in the past three days to demonstrate decreased nerve irritation    Time 4   Period Weeks   Status New     PT LONG TERM GOAL #2   Title Pt to be able to verbalize the importance of proper posture while completing house cleaning activity to avoic exacerbation of pain.    Time 4   Period Weeks   Status New     PT LONG TERM GOAL #3   Title Pt pain level to be no greater than a 2/10 to allow pt to complete a full work day in comfort    Time 4   Period Weeks   Status New     PT LONG TERM GOAL #4   Title  PT to be riding her motorcycle    Time 4   Period Weeks   Status New                Plan - 11/22/16 1209    Clinical Impression Statement [name] is a 56 yo female who has been referred  to skilled physical therapy for cervical radiculopathy.  At this time the patient states that she is only experiencing Rt arm pain no cervical pain.  Examination demonstrates decreased ROM, decreased strength,and increased pain,.  Kristen Lang will benefit from skilled physcial therapy to address these issues and maximize her functional ability.   History and Personal Factors relevant to plan of care: radicaular lumbar pain    Clinical Presentation Evolving   Clinical Decision Making Moderate   Rehab Potential Good   PT Frequency 2x / week   PT Duration 4 weeks   PT Treatment/Interventions ADLs/Self Care Home Management;Therapeutic exercise;Patient/family education;Manual techniques;Traction;Moist Heat   PT Next Visit Plan Begin cervical stability with UE motion; ie postrual exercises, simulating cleaning windows, wall pushups, continues with manual including manual traction.  May try cervical mechanical traction.    PT Home Exercise Plan HEP   Consulted and Agree with Plan of Care Patient      Patient will benefit from skilled therapeutic intervention in order to improve the following deficits and impairments:  Decreased activity tolerance, Decreased range of motion, Decreased strength, Pain  Visit Diagnosis: Pain in right arm - Plan: PT plan of care cert/re-cert  Other symptoms and signs involving the musculoskeletal system - Plan: PT plan of care cert/re-cert     Problem List Patient Active Problem List   Diagnosis Date Noted  . Pelvic mass in female 03/13/2013    Rayetta Humphrey, PT CLT [phone] 11/22/2016, 12:26 PM  [place] [address] [place], [place], [address] Phone: [phone]   Fax:  [phone]  Name: Azlyn Wingler MRN: [id number] Date of Birth: 1960/12/28

## 2016-11-22 NOTE — Patient Instructions (Signed)
1) cervical retraction: Keep looking ahead pull chin into your throat area while trying to elongate your neck: repeat 10x; 3 x a day.  2) scapular retraction:  Squeeze your shoulder blades back and down towards your bra clasp.  Hold 3 seconds x 10  3) cervical isometric for extension:  Place your hand behind your head.  Keeping your eyes level push back into your hand gently.  Hold for 3-5 seconds     And repeat 5-10 times.  Do 3 times a day.

## 2016-11-28 ENCOUNTER — Ambulatory Visit (HOSPITAL_COMMUNITY): Admitting: Physical Therapy

## 2016-11-28 DIAGNOSIS — M6281 Muscle weakness (generalized): Secondary | ICD-10-CM

## 2016-11-28 DIAGNOSIS — M79601 Pain in right arm: Secondary | ICD-10-CM | POA: Diagnosis not present

## 2016-11-28 DIAGNOSIS — R29898 Other symptoms and signs involving the musculoskeletal system: Secondary | ICD-10-CM

## 2016-11-28 DIAGNOSIS — R293 Abnormal posture: Secondary | ICD-10-CM

## 2016-11-28 NOTE — Therapy (Signed)
[place], [place], [address] Phone: [phone]   Fax:  [phone]  Physical Therapy Treatment  Patient Details  Name: Emri Sample MRN: [id number] Date of Birth: 04/19/1961 Referring Provider: Jenetta Loges  Encounter Date: 11/28/2016      PT End of Session - 11/28/16 1219    Visit Number 2   Number of Visits 8   Date for PT Re-Evaluation 12/22/16   Authorization Type [employer]   Authorization - Visit Number 2   Authorization - Number of Visits 8   PT Start Time 1120   PT Stop Time 1200   PT Time Calculation (min) 40 min   Activity Tolerance Patient tolerated treatment well   Behavior During Therapy [facility] for tasks assessed/performed      Past Medical History:  Diagnosis Date  . Allergy     Past Surgical History:  Procedure Laterality Date  . ABDOMINAL HYSTERECTOMY      There were no vitals filed for this visit.      Subjective Assessment - 11/28/16 1123    Subjective Pt has been doing her exercises and feels better.  Radicular pain is less    Pertinent History hx of radicular back pain.   Limitations Reading;Lifting;Writing;House hold activities   Patient Stated Goals less pain; to be able to complete cleaning without increased pain.     Currently in Pain? Yes   Pain Score 3    Pain Location Arm   Pain Orientation Right   Pain Descriptors / Indicators Aching   Pain Type Chronic pain   Pain Radiating Towards wrist    Pain Onset More than a month ago              [facility] Adult PT Treatment/Exercise - 11/28/16 0001      Posture/Postural Control   Posture/Postural Control No significant limitations     Exercises   Exercises Neck     Neck Exercises: Standing   Neck Retraction 10 reps   Other Standing Exercises scapular retraction x 10      Neck Exercises: Seated   Cervical Isometrics Extension;Left lateral flexion;10 reps   X to V 5 reps   W Back 10 reps     Neck  Exercises: Supine   Shoulder Flexion Both;5 reps     Manual Therapy   Manual Therapy Soft tissue mobilization;Manual Traction   Manual therapy comments done seperate from all other aspects of treatment.    Manual Traction supine                  PT Education - 11/28/16 1215    Education provided Yes   Education Details The importance of stabilizing the cervical area while using her upper extremities.    Person(s) Educated Patient   Methods Explanation   Comprehension Verbalized understanding          PT Short Term Goals - 11/28/16 1225      PT SHORT TERM GOAL #1   Title Pt to have no radicular sx past her right elbow to demonstrate decreased nerve irritation.    Time 2   Period Weeks   Status On-going     PT SHORT TERM GOAL #2   Title Pt pain level to be no greater than a 5/10 to allow pt to complete at least an hour of housecleaning without increased pain.     Time 2   Period Weeks   Status On-going  PT SHORT TERM GOAL #3   Title Pt to cervcial ROM to be wnl to allow safe driving    Time 2   Period Weeks   Status On-going           PT Long Term Goals - 11/28/16 1226      PT LONG TERM GOAL #1   Title Pt to have had no radicular sx  in the past three days to demonstrate decreased nerve irritation    Time 4   Period Weeks   Status On-going     PT LONG TERM GOAL #2   Title Pt to be able to verbalize the importance of proper posture while completing house cleaning activity to avoic exacerbation of pain.    Time 4   Period Weeks   Status On-going     PT LONG TERM GOAL #3   Title Pt pain level to be no greater than a 2/10 to allow pt to complete a full work day in comfort    Time 4   Period Weeks   Status On-going     PT LONG TERM GOAL #4   Title PT to be riding her motorcycle    Time 4   Period Weeks   Status New               Plan - 11/28/16 1220    Clinical Impression Statement Pt treatment focused on manual traction with soft  tissue mobilization to decrease tension. Pt verbalized no pain at end of session; pain was a 3/10 at beginning of session.    Rehab Potential Good   PT Frequency 2x / week   PT Duration 4 weeks   PT Treatment/Interventions ADLs/Self Care Home Management;Therapeutic exercise;Patient/family education;Manual techniques;Traction;Moist Heat   PT Next Visit Plan , simulating cleaning windows, wall pushups, continues with manual including manual traction.  May try cervical mechanical traction.    PT Home Exercise Plan HEP   Consulted and Agree with Plan of Care Patient      Patient will benefit from skilled therapeutic intervention in order to improve the following deficits and impairments:  Decreased activity tolerance, Decreased range of motion, Decreased strength, Pain  Visit Diagnosis: Pain in right arm  Other symptoms and signs involving the musculoskeletal system  Muscle weakness (generalized)  Abnormal posture     Problem List Patient Active Problem List   Diagnosis Date Noted  . Pelvic mass in female 03/13/2013    Rayetta Humphrey, PT CLT [phone] 11/28/2016, 12:27 PM  [facility] [address] [place], [place], [address] Phone: [phone]   Fax:  [phone]  Name: Racquelle Hyser MRN: [id number] Date of Birth: 26-Jun-1960

## 2016-11-30 ENCOUNTER — Ambulatory Visit (HOSPITAL_COMMUNITY)

## 2016-11-30 ENCOUNTER — Encounter (HOSPITAL_COMMUNITY)

## 2016-11-30 ENCOUNTER — Telehealth (HOSPITAL_COMMUNITY): Payer: Self-pay

## 2016-11-30 NOTE — Telephone Encounter (Signed)
No show #1; Spoke with pt regarding missed appointment. She stated that her schedule only had Monday appointments on it so she did not know she was supposed to come in today. PT reminded pt of her next appointment on 7/30 at 11:15 and told her that we would print her a new schedule then. Pt verbalized understanding and stated she would be here.  Geraldine Solar PT, DPT

## 2016-12-05 ENCOUNTER — Ambulatory Visit (HOSPITAL_COMMUNITY)

## 2016-12-05 ENCOUNTER — Encounter (HOSPITAL_COMMUNITY): Payer: Self-pay

## 2016-12-05 DIAGNOSIS — M6281 Muscle weakness (generalized): Secondary | ICD-10-CM

## 2016-12-05 DIAGNOSIS — M79601 Pain in right arm: Secondary | ICD-10-CM

## 2016-12-05 DIAGNOSIS — R293 Abnormal posture: Secondary | ICD-10-CM

## 2016-12-05 DIAGNOSIS — R29898 Other symptoms and signs involving the musculoskeletal system: Secondary | ICD-10-CM

## 2016-12-05 NOTE — Therapy (Signed)
[place], [place], [address] Phone: [phone]   Fax:  [phone]  Physical Therapy Treatment  Patient Details  Name: Kristen Lang MRN: [id number] Date of Birth: 27-Apr-1961 Referring Provider: Jenetta Loges  Encounter Date: 12/05/2016      PT End of Session - 12/05/16 1112    Visit Number 3   Number of Visits 8   Date for PT Re-Evaluation 12/22/16   Authorization Type [employer]   Authorization - Visit Number 3   Authorization - Number of Visits 8   PT Start Time 6962   PT Stop Time 1155   PT Time Calculation (min) 42 min   Activity Tolerance Patient tolerated treatment well   Behavior During Therapy [facility] for tasks assessed/performed      Past Medical History:  Diagnosis Date  . Allergy     Past Surgical History:  Procedure Laterality Date  . ABDOMINAL HYSTERECTOMY      There were no vitals filed for this visit.      Subjective Assessment - 12/05/16 1112    Subjective Pt states that she feels alright today. She states that her R shoulder bothered her over the weekend when she was working.    Pertinent History hx of radicular back pain.   Limitations Reading;Lifting;Writing;House hold activities   Patient Stated Goals less pain; to be able to complete cleaning without increased pain.     Currently in Pain? Yes   Pain Score 3    Pain Location Shoulder   Pain Orientation Right   Pain Descriptors / Indicators Aching   Pain Type Chronic pain   Pain Onset More than a month ago   Pain Frequency Constant   Aggravating Factors  cleaning   Pain Relieving Factors holding arm above her head   Effect of Pain on Daily Activities increases               [facility] Adult PT Treatment/Exercise - 12/05/16 0001      Neck Exercises: Standing   Wall Push Ups 10 reps   Wall Push Ups Limitations 2 sets with plus     Neck Exercises: Seated   Other Seated Exercise scap retraction with RTB 2x10 reps     Other Seated Exercise bil band pulls with RTB x 10 reps     Manual Therapy   Manual Therapy Soft tissue mobilization;Manual Traction   Manual therapy comments done seperate from all other aspects of treatment.    Soft tissue mobilization cross friction, efflurage, petrissage to R deltoid, supraspinatus, long head biceps    Manual Traction R shoulder manual distraction 10 x 10-15 sec bouts each     Neck Exercises: Stretches   Other Neck Stretches R biceps stretch in doorway 3x15 sec   Other Neck Stretches R posterior capsule stretch 3x15               PT Short Term Goals - 11/28/16 1225      PT SHORT TERM GOAL #1   Title Pt to have no radicular sx past her right elbow to demonstrate decreased nerve irritation.    Time 2   Period Weeks   Status On-going     PT SHORT TERM GOAL #2   Title Pt pain level to be no greater than a 5/10 to allow pt to complete at least an hour of housecleaning without increased pain.     Time 2   Period Weeks   Status On-going  PT SHORT TERM GOAL #3   Title Pt to cervcial ROM to be wnl to allow safe driving    Time 2   Period Weeks   Status On-going           PT Long Term Goals - 11/28/16 1226      PT LONG TERM GOAL #1   Title Pt to have had no radicular sx  in the past three days to demonstrate decreased nerve irritation    Time 4   Period Weeks   Status On-going     PT LONG TERM GOAL #2   Title Pt to be able to verbalize the importance of proper posture while completing house cleaning activity to avoic exacerbation of pain.    Time 4   Period Weeks   Status On-going     PT LONG TERM GOAL #3   Title Pt pain level to be no greater than a 2/10 to allow pt to complete a full work day in comfort    Time 4   Period Weeks   Status On-going     PT LONG TERM GOAL #4   Title PT to be riding her motorcycle    Time 4   Period Weeks   Status New               Plan - 12/05/16 1201    Clinical Impression Statement Session  focused on manual to R shoulder region as well as manual distraction to R shoulder. Pt presented with increased soft tissue restrictions and reported no pain after manual. Simulated window cleaning this date; pt uses her R arm to wipe and was noted to have increased R scap winging/improper stabilization. Followed up with therex to address this. PT assessed for tinel's sign for ulnar and radial nerve this date as pt c/o tighntess in her elbow; pt was positive for both so neural glides will be assessed for provocation/change in symptoms next session. Pt with no pain at EOS. Continue POC as planned.   Rehab Potential Good   PT Frequency 2x / week   PT Duration 4 weeks   PT Treatment/Interventions ADLs/Self Care Home Management;Therapeutic exercise;Patient/family education;Manual techniques;Traction;Moist Heat   PT Next Visit Plan scap stabilization exercises including wall pushups, shoulder taps in plantigrade, subscap strengthening, Y's on wall with lift off, etc. continue with manual (manual cervical traction, manual shoulder distraction). May try cervical mechanical traction; trial nerve glides   PT Home Exercise Plan HEP   Consulted and Agree with Plan of Care Patient      Patient will benefit from skilled therapeutic intervention in order to improve the following deficits and impairments:  Decreased activity tolerance, Decreased range of motion, Decreased strength, Pain  Visit Diagnosis: Pain in right arm  Other symptoms and signs involving the musculoskeletal system  Muscle weakness (generalized)  Abnormal posture     Problem List Patient Active Problem List   Diagnosis Date Noted  . Pelvic mass in female 03/13/2013      Geraldine Solar PT, DPT  [facility] [address] [place], [place], [address] Phone: [phone]   Fax:  [phone]  Name: Blessing Zaucha MRN: [id number] Date of Birth: 01-Jan-1961

## 2016-12-07 ENCOUNTER — Encounter (HOSPITAL_COMMUNITY): Payer: Self-pay

## 2016-12-07 ENCOUNTER — Ambulatory Visit (HOSPITAL_COMMUNITY): Attending: Orthopedic Surgery

## 2016-12-07 DIAGNOSIS — R293 Abnormal posture: Secondary | ICD-10-CM | POA: Diagnosis present

## 2016-12-07 DIAGNOSIS — M79601 Pain in right arm: Secondary | ICD-10-CM

## 2016-12-07 DIAGNOSIS — M6281 Muscle weakness (generalized): Secondary | ICD-10-CM | POA: Diagnosis present

## 2016-12-07 DIAGNOSIS — R29898 Other symptoms and signs involving the musculoskeletal system: Secondary | ICD-10-CM

## 2016-12-07 NOTE — Therapy (Signed)
[place], [place], [address] Phone: [phone]   Fax:  [phone]  Physical Therapy Treatment  Patient Details  Name: Kristen Lang MRN: [id number] Date of Birth: [birth year] Referring Provider: Jenetta Loges  Encounter Date: 12/07/2016      PT End of Session - 12/07/16 1124    Visit Number 4   Number of Visits 8   Date for PT Re-Evaluation 12/22/16   Authorization Type [employer]   Authorization - Visit Number 4   Authorization - Number of Visits 8   PT Start Time 0272   PT Stop Time 1201   PT Time Calculation (min) 40 min   Activity Tolerance Patient tolerated treatment well   Behavior During Therapy [facility] for tasks assessed/performed      Past Medical History:  Diagnosis Date  . Allergy     Past Surgical History:  Procedure Laterality Date  . ABDOMINAL HYSTERECTOMY      There were no vitals filed for this visit.      Subjective Assessment - 12/07/16 1123    Subjective Pt states that she felt fine following last treatment session. She reports her shoulder is bugging her currently and she thinks it's because it gets cold inside. She found her TENS unit at home and used it the night before last; she reported that it felt good.   Pertinent History hx of radicular back pain.   Limitations Reading;Lifting;Writing;House hold activities   Patient Stated Goals less pain; to be able to complete cleaning without increased pain.     Currently in Pain? Yes   Pain Score 3    Pain Location Shoulder   Pain Orientation Right   Pain Descriptors / Indicators Aching   Pain Type Chronic pain   Pain Onset More than a month ago   Pain Frequency Constant   Aggravating Factors  cleaning   Pain Relieving Factors holding arm above her head   Effect of Pain on Daily Activities increases              [facility] Adult PT Treatment/Exercise - 12/07/16 0001      Neck Exercises: Standing   Left / Chop Limitations  bodyblade, elbow straight, horiz and vert 3 x 10" each, BUE   Other Standing Exercises bil low rows with GTB 2x10; Y's on with liftoff 2x10 reps; high Y's with RTB x 15 reps   Other Standing Exercises scap retraction with RTB x10 reps, GTB x15 reps; bear hugs with GTB 2x10     Neck Exercises: Supine   Other Supine Exercise Radial nerve glides x1 min with head turns, x 1 min no head turns   Other Supine Exercise ulnar nerve glides x 1 min with head turns, x1 min with no head turns     Manual Therapy   Manual Therapy Manual Traction   Manual therapy comments done seperate from all other aspects of treatment.    Manual Traction R shoulder manual distraction 10x10-15" bouts; manual cervical traction 10x10-15" bouts each              PT Education - 12/07/16 1204    Education provided Yes   Education Details exercise technique, will continue nerve glides   Person(s) Educated Patient   Methods Explanation;Demonstration   Comprehension Verbalized understanding;Returned demonstration          PT Short Term Goals - 11/28/16 1225      PT SHORT TERM GOAL #1   Title Pt  to have no radicular sx past her right elbow to demonstrate decreased nerve irritation.    Time 2   Period Weeks   Status On-going     PT SHORT TERM GOAL #2   Title Pt pain level to be no greater than a 5/10 to allow pt to complete at least an hour of housecleaning without increased pain.     Time 2   Period Weeks   Status On-going     PT SHORT TERM GOAL #3   Title Pt to cervcial ROM to be wnl to allow safe driving    Time 2   Period Weeks   Status On-going           PT Long Term Goals - 11/28/16 1226      PT LONG TERM GOAL #1   Title Pt to have had no radicular sx  in the past three days to demonstrate decreased nerve irritation    Time 4   Period Weeks   Status On-going     PT LONG TERM GOAL #2   Title Pt to be able to verbalize the importance of proper posture while completing house cleaning  activity to avoic exacerbation of pain.    Time 4   Period Weeks   Status On-going     PT LONG TERM GOAL #3   Title Pt pain level to be no greater than a 2/10 to allow pt to complete a full work day in comfort    Time 4   Period Weeks   Status On-going     PT LONG TERM GOAL #4   Title PT to be riding her motorcycle    Time 4   Period Weeks   Status New               Plan - 12/07/16 1203    Clinical Impression Statement Assessed median, ulnar and radial nerve glides this date and pt reported that her pain was recreated with each, but especially with radial and ulnar. Performed nerve glides/flossing for radial and ulnar nerves; she reported tingling in her hands with each which subsided with a rest break. Manual cervical traction performed again this date and she reported no pain following. Ended session with postural strengthening and scap stab exercises. She was able to perform all without c/o pain. Continue POC as planned.   Rehab Potential Good   PT Frequency 2x / week   PT Duration 4 weeks   PT Treatment/Interventions ADLs/Self Care Home Management;Therapeutic exercise;Patient/family education;Manual techniques;Traction;Moist Heat   PT Next Visit Plan scap stabilization exercises including wall pushups, shoulder taps in plantigrade, serratus strengthening, Y's on wall with lift off, etc. continue with manual (manual cervical traction, manual shoulder distraction). May try cervical mechanical traction; continue radial and uldar glides (give as HEP potentially)   PT Home Exercise Plan HEP   Consulted and Agree with Plan of Care Patient      Patient will benefit from skilled therapeutic intervention in order to improve the following deficits and impairments:  Decreased activity tolerance, Decreased range of motion, Decreased strength, Pain  Visit Diagnosis: Pain in right arm  Other symptoms and signs involving the musculoskeletal system  Muscle weakness  (generalized)  Abnormal posture     Problem List Patient Active Problem List   Diagnosis Date Noted  . Pelvic mass in female 03/13/2013     Geraldine Solar PT, DPT  [facility] [address] [place], [place], [address]  Phone: [phone]   Fax:  [phone]  Name: Kristen Lang MRN: [id number] Date of Birth: 1960-12-15

## 2016-12-12 ENCOUNTER — Ambulatory Visit (HOSPITAL_COMMUNITY)

## 2016-12-12 ENCOUNTER — Encounter (HOSPITAL_COMMUNITY)

## 2016-12-12 ENCOUNTER — Encounter (HOSPITAL_COMMUNITY): Payer: Self-pay

## 2016-12-12 DIAGNOSIS — M79601 Pain in right arm: Secondary | ICD-10-CM

## 2016-12-12 DIAGNOSIS — R293 Abnormal posture: Secondary | ICD-10-CM

## 2016-12-12 DIAGNOSIS — R29898 Other symptoms and signs involving the musculoskeletal system: Secondary | ICD-10-CM

## 2016-12-12 DIAGNOSIS — M6281 Muscle weakness (generalized): Secondary | ICD-10-CM

## 2016-12-12 NOTE — Therapy (Signed)
[place], [place], [address] Phone: [phone]   Fax:  [phone]  Physical Therapy Treatment  Patient Details  Name: Kristen Lang MRN: [id number] Date of Birth: 11-12-1960 Referring Provider: Jenetta Loges  Encounter Date: 12/12/2016      PT End of Session - 12/12/16 1124    Visit Number 5   Number of Visits 8   Date for PT Re-Evaluation 12/22/16   Authorization Type [employer]   Authorization - Visit Number 5   Authorization - Number of Visits 8   PT Start Time 1287   PT Stop Time 1201   PT Time Calculation (min) 40 min   Activity Tolerance Patient tolerated treatment well   Behavior During Therapy [facility] for tasks assessed/performed      Past Medical History:  Diagnosis Date  . Allergy     Past Surgical History:  Procedure Laterality Date  . ABDOMINAL HYSTERECTOMY      There were no vitals filed for this visit.      Subjective Assessment - 12/12/16 1123    Subjective Pt states that her R shoulder continues to have pain when she gets cold. She states that it did not give her any issues over the weekend. She was able to work over the weekend with no pain. No real pain right now, just the achiness from the cold air.   Pertinent History hx of radicular back pain.   Limitations Reading;Lifting;Writing;House hold activities   Patient Stated Goals less pain; to be able to complete cleaning without increased pain.     Currently in Pain? No/denies   Pain Onset More than a month ago             [facility] Adult PT Treatment/Exercise - 12/12/16 0001      Neck Exercises: Standing   Wall Push Ups 10 reps   Wall Push Ups Limitations 2 sets with plus   Left / Chop Limitations bodyblade, elbow straight, horiz and vert 3 x 20" each, BUE   Other Standing Exercises Y's on wall with liftofff with GTB x15 reps   Other Standing Exercises shoudler taps in modified plantigrade position maintaining serratus  anterior engagement x10 reps each; bear hugs with BTB 2x10 reps     Neck Exercises: Seated   Upper Extremity D2 Flexion;15 reps;Theraband  2 sets   Theraband Level (UE D2) Level 3 (Green)     Neck Exercises: Supine   Other Supine Exercise R chest press with plus 2x10 with 8# DB     Neck Exercises: Prone   Plank elbows and knees straight, 5x10" holds               PT Education - 12/12/16 1203    Education provided Yes   Education Details continue HEP, exercise technique   Person(s) Educated Patient   Methods Explanation;Demonstration   Comprehension Verbalized understanding;Returned demonstration          PT Short Term Goals - 11/28/16 1225      PT SHORT TERM GOAL #1   Title Pt to have no radicular sx past her right elbow to demonstrate decreased nerve irritation.    Time 2   Period Weeks   Status On-going     PT SHORT TERM GOAL #2   Title Pt pain level to be no greater than a 5/10 to allow pt to complete at least an hour of housecleaning without increased pain.     Time 2  Period Weeks   Status On-going     PT SHORT TERM GOAL #3   Title Pt to cervcial ROM to be wnl to allow safe driving    Time 2   Period Weeks   Status On-going           PT Long Term Goals - 11/28/16 1226      PT LONG TERM GOAL #1   Title Pt to have had no radicular sx  in the past three days to demonstrate decreased nerve irritation    Time 4   Period Weeks   Status On-going     PT LONG TERM GOAL #2   Title Pt to be able to verbalize the importance of proper posture while completing house cleaning activity to avoic exacerbation of pain.    Time 4   Period Weeks   Status On-going     PT LONG TERM GOAL #3   Title Pt pain level to be no greater than a 2/10 to allow pt to complete a full work day in comfort    Time 4   Period Weeks   Status On-going     PT LONG TERM GOAL #4   Title PT to be riding her motorcycle    Time 4   Period Weeks   Status New                Plan - 12/12/16 1204    Clinical Impression Statement Pt presented to therapy without c/o pain, just achiness in the shoulder due to being cold in the office. Otherwise, pt stated that she was able to work all weekend without issue. Did not complete nerve glides this date as pt was not in any pain at beginning of session. She did c/o 2 bouts of hand tingling during shoulder taps and wall push-ups this date; each time it subsided with brief rest break and pt reported that her arm had gotten cold again, which she contributed to the tingling. Will continue to follow this in future sessions. Will see how pt tolerated all therex and no manual at her next scheduled appointment and will address as necessary. Continue POC as planned.   Rehab Potential Good   PT Frequency 2x / week   PT Duration 4 weeks   PT Treatment/Interventions ADLs/Self Care Home Management;Therapeutic exercise;Patient/family education;Manual techniques;Traction;Moist Heat   PT Next Visit Plan continue scap stabilization exercises; see how pt responsded to only performing therex with no manual, can continue manual PRN. May try cervical mechanical traction; continue radial and uldar glides PRN (give as HEP potentially)   PT Home Exercise Plan HEP   Consulted and Agree with Plan of Care Patient      Patient will benefit from skilled therapeutic intervention in order to improve the following deficits and impairments:  Decreased activity tolerance, Decreased range of motion, Decreased strength, Pain  Visit Diagnosis: Pain in right arm  Other symptoms and signs involving the musculoskeletal system  Muscle weakness (generalized)  Abnormal posture     Problem List Patient Active Problem List   Diagnosis Date Noted  . Pelvic mass in female 03/13/2013     Geraldine Solar PT, DPT  [facility] [address] [place], [place], [address] Phone: [phone]   Fax:  [phone]  Name:  Kristen Lang MRN: [id number] Date of Birth: 09/20/1960

## 2016-12-14 ENCOUNTER — Encounter (HOSPITAL_COMMUNITY): Payer: Self-pay

## 2016-12-14 ENCOUNTER — Ambulatory Visit (HOSPITAL_COMMUNITY)

## 2016-12-14 DIAGNOSIS — M79601 Pain in right arm: Secondary | ICD-10-CM | POA: Diagnosis not present

## 2016-12-14 DIAGNOSIS — M6281 Muscle weakness (generalized): Secondary | ICD-10-CM

## 2016-12-14 DIAGNOSIS — R293 Abnormal posture: Secondary | ICD-10-CM

## 2016-12-14 DIAGNOSIS — R29898 Other symptoms and signs involving the musculoskeletal system: Secondary | ICD-10-CM

## 2016-12-14 NOTE — Patient Instructions (Signed)
  RADIAL NERVE GLIDE - B  Start with your arm hanging down at your side with your elbows straight and palm facing back. Next, bend your wrist back as you side bend your head towards the target arm as shown. Next, bend your wrist forward as you side bend your head away from the target arm.   Your other hand should be checking to make sure that your shoulder stays down and drawn back the entire time.   Perform at least 1x/day, 2-3 sets of 10 reps on the R; can also perform this anytime you feel the tingling in your R arm.

## 2016-12-14 NOTE — Therapy (Signed)
[place], [place], [address] Phone: [phone]   Fax:  [phone]  Physical Therapy Treatment  Patient Details  Name: Kristen Lang MRN: [id number] Date of Birth: 07-25-1960 Referring Provider: Jenetta Loges  Encounter Date: 12/14/2016      PT End of Session - 12/14/16 1120    Visit Number 6   Number of Visits 8   Date for PT Re-Evaluation 12/22/16   Authorization Type [employer]   Authorization - Visit Number 6   Authorization - Number of Visits 8   PT Start Time 0881   PT Stop Time 1200   PT Time Calculation (min) 42 min   Activity Tolerance Patient tolerated treatment well   Behavior During Therapy [facility] for tasks assessed/performed      Past Medical History:  Diagnosis Date  . Allergy     Past Surgical History:  Procedure Laterality Date  . ABDOMINAL HYSTERECTOMY      There were no vitals filed for this visit.      Subjective Assessment - 12/14/16 1120    Subjective Pt states that she felt fine following last session. She reports that when she bends over to tie her shoes, her R thumb tingles/feels funny.    Pertinent History hx of radicular back pain.   Limitations Reading;Lifting;Writing;House hold activities   Patient Stated Goals less pain; to be able to complete cleaning without increased pain.     Currently in Pain? No/denies   Pain Onset More than a month ago              [facility] Adult PT Treatment/Exercise - 12/14/16 0001      Neck Exercises: Standing   Wall Push Ups 10 reps   Wall Push Ups Limitations 2 sets with GTB and plus   Wall Wash x15 reps on R with 3# weight, x10 reps on L with 3# weight   Other Standing Exercises ER in 90/90 with RTB x20 reps   Other Standing Exercises self-radial nerve glides x30 reps total throughout session for decreased tingling in RUE     Neck Exercises: Supine   Other Supine Exercise --     Neck Exercises: Sidelying   Other Sidelying  Exercise sideplank on elbow 5x10" on R elbow with knees bent     Neck Exercises: Prone   Plank elbows and knees straight, 5x10" holds   Other Prone Exercise I's, Y's, T's, W's x10 reps each     Manual Therapy   Manual Therapy Neural Stretch   Manual therapy comments done seperate from all other aspects of treatment.    Neural Stretch Radial nerve glides x10reps, 2x15reps with head movements; reassessing radial nerve glide in between sets to assess for response; by end, pt able to tolerate stretch to WNL of typical symptom provocation               PT Education - 12/14/16 1201    Education provided Yes   Education Details added self radial nerve glides to HEP as it decreased the tingling in her RUE; importance of scap stabilization especially with the work that she does   Person(s) Educated Patient   Methods Explanation;Demonstration;Handout   Comprehension Verbalized understanding;Returned demonstration          PT Short Term Goals - 11/28/16 1225      PT SHORT TERM GOAL #1   Title Pt to have no radicular sx past her right elbow to demonstrate decreased nerve irritation.  Time 2   Period Weeks   Status On-going     PT SHORT TERM GOAL #2   Title Pt pain level to be no greater than a 5/10 to allow pt to complete at least an hour of housecleaning without increased pain.     Time 2   Period Weeks   Status On-going     PT SHORT TERM GOAL #3   Title Pt to cervcial ROM to be wnl to allow safe driving    Time 2   Period Weeks   Status On-going           PT Long Term Goals - 11/28/16 1226      PT LONG TERM GOAL #1   Title Pt to have had no radicular sx  in the past three days to demonstrate decreased nerve irritation    Time 4   Period Weeks   Status On-going     PT LONG TERM GOAL #2   Title Pt to be able to verbalize the importance of proper posture while completing house cleaning activity to avoic exacerbation of pain.    Time 4   Period Weeks   Status  On-going     PT LONG TERM GOAL #3   Title Pt pain level to be no greater than a 2/10 to allow pt to complete a full work day in comfort    Time 4   Period Weeks   Status On-going     PT LONG TERM GOAL #4   Title PT to be riding her motorcycle    Time 4   Period Weeks   Status New               Plan - 12/14/16 1212    Clinical Impression Statement Pt presented to therapy with no reports of pain this date and no issues following last session, however, she did report tingling/feeling funny in her thumb since tying her shoe this morning. Performed radial nerve glides again and pt reported that radial nerve glides recreated her pain and increased her tingling. Performed multiple bouts of radial nerve flossing with head movements; reassessed nerve glide afterwards and pt able to get further into ROM/into WNL for the testing following. Pt tolerated progressed therex well, but had another bout of tingling; instructed pt on how to perform self-radial nerve glides and pt reported that the tingling completely resided. Added this to HEP. Pt also demo'd scapular winging, R>L, throughout [place] activities and she had difficulty performing scap stabilization likely due to weakness. Continue POC as planned.   Rehab Potential Good   PT Frequency 2x / week   PT Duration 4 weeks   PT Treatment/Interventions ADLs/Self Care Home Management;Therapeutic exercise;Patient/family education;Manual techniques;Traction;Moist Heat   PT Next Visit Plan continue scap stabilization exercises; can continue manual PRN. May try cervical mechanical traction; continue radial PRN;    PT Home Exercise Plan HEP; 8/8: self-radial nerve glides with head tilts   Consulted and Agree with Plan of Care Patient      Patient will benefit from skilled therapeutic intervention in order to improve the following deficits and impairments:  Decreased activity tolerance, Decreased range of motion, Decreased strength, Pain  Visit  Diagnosis: Pain in right arm  Other symptoms and signs involving the musculoskeletal system  Muscle weakness (generalized)  Abnormal posture     Problem List Patient Active Problem List   Diagnosis Date Noted  . Pelvic mass in female 03/13/2013     Geraldine Solar  PT, DPT  [facility] [address] [place], [place], [address] Phone: [phone]   Fax:  [phone]  Name: Kristen Lang MRN: [id number] Date of Birth: 05-07-1961

## 2016-12-19 ENCOUNTER — Ambulatory Visit (HOSPITAL_COMMUNITY): Admitting: Physical Therapy

## 2016-12-19 DIAGNOSIS — R29898 Other symptoms and signs involving the musculoskeletal system: Secondary | ICD-10-CM

## 2016-12-19 DIAGNOSIS — M6281 Muscle weakness (generalized): Secondary | ICD-10-CM

## 2016-12-19 DIAGNOSIS — M79601 Pain in right arm: Secondary | ICD-10-CM | POA: Diagnosis not present

## 2016-12-19 DIAGNOSIS — R293 Abnormal posture: Secondary | ICD-10-CM

## 2016-12-19 NOTE — Therapy (Signed)
[facility] [address] [place], [place], [address] Phone: [phone]   Fax:  [phone]  Physical Therapy Treatment  Patient Details  Name: Kristen Lang MRN: [id number] Date of Birth: 08-11-1960 Referring Provider: Jenetta Loges   Encounter Date: 12/19/2016    Past Medical History:  Diagnosis Date  . Allergy     Past Surgical History:  Procedure Laterality Date  . ABDOMINAL HYSTERECTOMY      There were no vitals filed for this visit.      Subjective Assessment - 12/19/16 0908    Subjective Pt states that she has been doing her exercises and feels some better. She went camping and four wheeling over the weekend.    Pertinent History hx of radicular back pain.   Limitations Reading;Lifting;Writing;House hold activities   Patient Stated Goals less pain; to be able to complete cleaning without increased pain.     Currently in Pain? No/denies  her pain has been as high as an 8/10 this week but her high pain is not as frequent as it was.    Pain Onset More than a month ago            [facility] PT Assessment - 12/19/16 0001      Assessment   Medical Diagnosis cervical radiculopathy   Referring Provider Jenetta Loges    Onset Date/Surgical Date 08/21/16   Hand Dominance Right   Next MD Visit 12/20/2016   Prior Therapy not for cervical area     Precautions   Precautions None     Restrictions   Weight Bearing Restrictions No     Balance Screen   Has the patient fallen in the past 6 months No   Has the patient had a decrease in activity level because of a fear of falling?  No   Is the patient reluctant to leave their home because of a fear of falling?  No     Prior Function   Level of Independence Independent   Vocation Part time employment   Vocation Requirements cleaning   Leisure riding motorcycle      Cognition   Overall Cognitive Status Within Functional Limits for tasks assessed     Observation/Other  Assessments   Focus on Therapeutic Outcomes (FOTO)  75 was 58     Posture/Postural Control   Posture/Postural Control No significant limitations     AROM   Overall AROM Comments Shoulder ROM wnl    Cervical Extension functional with increased pain with reps    Cervical - Right Side Bend 42 was 25 reps no change    Cervical - Left Side Bend 45 was 40 reps improve pain    Cervical - Right Rotation 70 was 60   Cervical - Left Rotation 70 was 70     Strength   Overall Strength Comments cervical extension  4/5 was 3+/5 all others functional    Right Shoulder Flexion 5/5  was 4/5    Right Shoulder ABduction 5/5  was 4/5   Right Shoulder Internal Rotation 5/5   Right Shoulder External Rotation 5/5  was 4+/5   Cervical Extension 5/5  was 3+/5      Palpation   Palpation comment no spasms but some tightness and tenderness in Rt supraspinatus mm.                               PT Short Term Goals - 12/19/16  0917      PT SHORT TERM GOAL #1   Title Pt to have no radicular sx past her right elbow to demonstrate decreased nerve irritation.    Time 2   Period Weeks   Status Partially Met  still has tingling but no pain into her thumb.      PT SHORT TERM GOAL #2   Title Pt pain level to be no greater than a 5/10 to allow pt to complete at least an hour of housecleaning without increased pain.     Time 2   Period Weeks   Status Achieved  Pain has been good up until she rode the 4 wheeler      PT [place] #3   Title Pt to cervcial ROM to be wnl to allow safe driving    Time 2   Period Weeks   Status Achieved           PT Long Term Goals - 12/19/16 0919      PT LONG TERM GOAL #1   Title Pt to have had no radicular sx  in the past three days to demonstrate decreased nerve irritation    Time 4   Period Weeks   Status Partially Met     PT LONG TERM GOAL #2   Title Pt to be able to verbalize the importance of proper posture while completing  house cleaning activity to avoic exacerbation of pain.    Time 4   Period Weeks   Status Partially Met     PT LONG TERM GOAL #3   Title Pt pain level to be no greater than a 2/10 to allow pt to complete a full work day in comfort    Time 4   Period Weeks   Status Partially Met     PT LONG TERM GOAL #4   Title PT to be riding her motorcycle    Time 4   Period Weeks   Status Achieved             Patient will benefit from skilled therapeutic intervention in order to improve the following deficits and impairments:     Visit Diagnosis: Pain in right arm  Other symptoms and signs involving the musculoskeletal system  Muscle weakness (generalized)  Abnormal posture     Problem List Patient Active Problem List   Diagnosis Date Noted  . Pelvic mass in female 03/13/2013    Alistair Senft,CINDY 12/19/2016, 9:38 AM  [place], [place], [address] Phone: [phone]   Fax:  [phone]  Name: Kristen Lang MRN: [id number] Date of Birth: 1960/07/11

## 2016-12-19 NOTE — Therapy (Signed)
[place], [place], [address] Phone: [phone]   Fax:  [phone]  Physical Therapy Treatment/Discharge  Patient Details  Name: Kristen Lang MRN: [id number] Date of Birth: [birth year] Referring Provider: Jenetta Loges   Encounter Date: 12/19/2016      PT End of Session - 12/19/16 0939    Visit Number 7   Number of Visits 7   Date for PT Re-Evaluation 12/22/16   Authorization Type [employer]   Authorization - Visit Number 7   Authorization - Number of Visits 7   PT Start Time 0906   PT Stop Time 0936   PT Time Calculation (min) 30 min   Activity Tolerance Patient tolerated treatment well   Behavior During Therapy [facility] for tasks assessed/performed      Past Medical History:  Diagnosis Date  . Allergy     Past Surgical History:  Procedure Laterality Date  . ABDOMINAL HYSTERECTOMY      There were no vitals filed for this visit.      Subjective Assessment - 12/19/16 0908    Subjective Pt states that she has been doing her exercises and feels some better. She went camping and four wheeling over the weekend.    Pertinent History hx of radicular back pain.   Limitations Reading;Lifting;Writing;House hold activities   Patient Stated Goals less pain; to be able to complete cleaning without increased pain.     Currently in Pain? No/denies  her pain has been as high as an 8/10 this week but her high pain is not as frequent as it was.    Pain Onset More than a month ago            [facility] PT Assessment - 12/19/16 0001      Assessment   Medical Diagnosis cervical radiculopathy   Referring Provider Jenetta Loges    Onset Date/Surgical Date 08/21/16   Hand Dominance Right   Next MD Visit 12/20/2016   Prior Therapy not for cervical area     Precautions   Precautions None     Restrictions   Weight Bearing Restrictions No     Balance Screen   Has the patient fallen in the past 6 months No   Has  the patient had a decrease in activity level because of a fear of falling?  No   Is the patient reluctant to leave their home because of a fear of falling?  No     Prior Function   Level of Independence Independent   Vocation Part time employment   Vocation Requirements cleaning   Leisure riding motorcycle      Cognition   Overall Cognitive Status Within Functional Limits for tasks assessed     Observation/Other Assessments   Focus on Therapeutic Outcomes (FOTO)  75 was 58     Posture/Postural Control   Posture/Postural Control No significant limitations     AROM   Overall AROM Comments Shoulder ROM wnl    Cervical Extension functional with increased pain with reps    Cervical - Right Side Bend 42 was 25 reps no change    Cervical - Left Side Bend 45 was 40 reps improve pain    Cervical - Right Rotation 70 was 60   Cervical - Left Rotation 70 was 70     Strength   Overall Strength Comments cervical extension  4/5 was 3+/5 all others functional    Right Shoulder Flexion 5/5  was 4/5  Right Shoulder ABduction 5/5  was 4/5   Right Shoulder Internal Rotation 5/5   Right Shoulder External Rotation 5/5  was 4+/5   Cervical Extension 5/5  was 3+/5      Palpation   Palpation comment no spasms but some tightness and tenderness in Rt supraspinatus mm.                               PT Short Term Goals - 12/19/16 0917      PT SHORT TERM GOAL #1   Title Pt to have no radicular sx past her right elbow to demonstrate decreased nerve irritation.    Time 2   Period Weeks   Status Partially Met  still has tingling but no pain into her thumb.      PT SHORT TERM GOAL #2   Title Pt pain level to be no greater than a 5/10 to allow pt to complete at least an hour of housecleaning without increased pain.     Time 2   Period Weeks   Status Achieved  Pain has been good up until she rode the 4 wheeler      PT [place] #3   Title Pt to cervcial ROM to  be wnl to allow safe driving    Time 2   Period Weeks   Status Achieved           PT Long Term Goals - 12/19/16 0919      PT LONG TERM GOAL #1   Title Pt to have had no radicular sx  in the past three days to demonstrate decreased nerve irritation    Time 4   Period Weeks   Status Partially Met     PT LONG TERM GOAL #2   Title Pt to be able to verbalize the importance of proper posture while completing house cleaning activity to avoic exacerbation of pain.    Time 4   Period Weeks   Status Partially Met     PT LONG TERM GOAL #3   Title Pt pain level to be no greater than a 2/10 to allow pt to complete a full work day in comfort    Time 4   Period Weeks   Status Partially Met     PT LONG TERM GOAL #4   Title PT to be riding her motorcycle    Time 4   Period Weeks   Status Achieved               Plan - 12/19/16 0940    Clinical Impression Statement Pt reassessed this treatment.  Pt is significantly better.  She still has bouts of significant pain but lasts briefly and is normally after 4-wheeling or motorcyle riding.  Pt feels that she is ready for discharge at this time.    Rehab Potential Good   PT Frequency 2x / week   PT Duration 4 weeks   PT Treatment/Interventions ADLs/Self Care Home Management;Therapeutic exercise;Patient/family education;Manual techniques;Traction;Moist Heat   PT Next Visit Plan discharge.   PT Home Exercise Plan HEP; 8/8: self-radial nerve glides with head tilts   Consulted and Agree with Plan of Care Patient      Patient will benefit from skilled therapeutic intervention in order to improve the following deficits and impairments:  Decreased activity tolerance, Decreased range of motion, Decreased strength, Pain  Visit Diagnosis: Pain in right arm  Other symptoms and signs  involving the musculoskeletal system  Muscle weakness (generalized)  Abnormal posture     Problem List Patient Active Problem List   Diagnosis Date  Noted  . Pelvic mass in female 03/13/2013   Rayetta Humphrey, PT CLT [phone] 12/19/2016, 9:41 AM  [facility] [place], [place], [address] Phone: [phone]   Fax:  [phone]  Name: Ixel Boehning MRN: [id number] Date of Birth: 09-13-1960  PHYSICAL THERAPY DISCHARGE SUMMARY  Visits from Start of Care: 7  Current functional level related to goals / functional outcomes: See above   Remaining deficits: See above   Education / Equipment: HEP Plan: Patient agrees to discharge.  Patient goals were partially met. Patient is being discharged due to being pleased with the current functional level.  ?????        Rayetta Humphrey, [place] CLT [phone]

## 2016-12-21 ENCOUNTER — Ambulatory Visit (HOSPITAL_COMMUNITY): Admitting: Physical Therapy

## 2018-07-24 ENCOUNTER — Encounter: Payer: Self-pay | Admitting: *Deleted

## 2018-07-25 ENCOUNTER — Other Ambulatory Visit (HOSPITAL_COMMUNITY): Payer: Self-pay | Admitting: Family

## 2018-07-25 DIAGNOSIS — Z1231 Encounter for screening mammogram for malignant neoplasm of breast: Secondary | ICD-10-CM

## 2018-08-02 ENCOUNTER — Ambulatory Visit

## 2018-08-08 ENCOUNTER — Ambulatory Visit

## 2018-08-29 ENCOUNTER — Ambulatory Visit (HOSPITAL_COMMUNITY)

## 2018-09-12 ENCOUNTER — Ambulatory Visit: Payer: Self-pay

## 2018-09-21 ENCOUNTER — Ambulatory Visit (HOSPITAL_COMMUNITY)
Admission: RE | Admit: 2018-09-21 | Discharge: 2018-09-21 | Disposition: A | Discharge status: Home / Self Care | Source: Ambulatory Visit | Attending: Family | Admitting: Family

## 2018-09-21 ENCOUNTER — Other Ambulatory Visit: Payer: Self-pay

## 2018-09-21 DIAGNOSIS — Z1231 Encounter for screening mammogram for malignant neoplasm of breast: Secondary | ICD-10-CM | POA: Diagnosis not present

## 2018-09-27 ENCOUNTER — Encounter: Payer: Self-pay | Admitting: Gastroenterology

## 2018-10-17 ENCOUNTER — Ambulatory Visit

## 2018-12-26 ENCOUNTER — Other Ambulatory Visit: Payer: Self-pay

## 2018-12-26 ENCOUNTER — Ambulatory Visit (INDEPENDENT_AMBULATORY_CARE_PROVIDER_SITE_OTHER): Payer: Self-pay | Admitting: *Deleted

## 2018-12-26 DIAGNOSIS — Z1211 Encounter for screening for malignant neoplasm of colon: Secondary | ICD-10-CM

## 2018-12-26 MED ORDER — PEG 3350-KCL-NA BICARB-NACL 420 G PO SOLR: 4000.0000 mL | Freq: Once | ORAL | 0 refills | Status: AC

## 2018-12-26 NOTE — Progress Notes (Signed)
Gastroenterology Pre-Procedure Review  Request Date: 12/26/2018 Requesting Physician: Dr. Roanna Epley Zhou-Talbert @ [facility], No previous TCS  PATIENT REVIEW QUESTIONS: The patient responded to the following health history questions as indicated:    1. Diabetes Melitis: No 2. Joint replacements in the past 12 months: No 3. Major health problems in the past 3 months: No 4. Has an artificial valve or MVP: No 5. Has a defibrillator: No 6. Has been advised in past to take antibiotics in advance of a procedure like teeth cleaning: No 7. Family history of colon cancer: Yes, grandfather age 72  8. Alcohol Use: Yes, 10 beers a week 9. History of sleep apnea: No  10. History of coronary artery or other vascular stents placed within the last 12 months: No 11. History of any prior anesthesia complications: No    MEDICATIONS & ALLERGIES:    Patient reports the following regarding taking any blood thinners:   Plavix? No Aspirin? No Coumadin? No Brilinta? No Xarelto? No Eliquis? No Pradaxa? No Savaysa? No Effient? No  Patient confirms/reports the following medications:  Current Outpatient Medications  Medication Sig Dispense Refill  . Ascorbic Acid (VITAMIN C) 1000 MG tablet Take 1,000 mg by mouth once a week.    . Cholecalciferol (VITAMIN D3) 125 MCG (5000 UT) CAPS Take by mouth once a week.    Marland Kitchen Cod Liver Oil CAPS Take by mouth daily.    . Vitamin A 2400 MCG (8000 UT) CAPS Take by mouth once a week.     No current facility-administered medications for this visit.     Patient confirms/reports the following allergies:  Allergies  Allergen Reactions  . Dilaudid [Hydromorphone Hcl]   . Hydrocodone     No orders of the defined types were placed in this encounter.   AUTHORIZATION INFORMATION Primary Insurance: Dyanne Iha,  [place] #: [id number] Pre-Cert / Josem Kaufmann required: No, not required  SCHEDULE INFORMATION: Procedure has been scheduled as follows:  Date: 02/11/2019,  Time: 8:30 Location: [facility] with Dr. Oneida Alar  This Gastroenterology Pre-Precedure Review Form is being routed to the following provider(s): Aliene Altes, PA-C

## 2018-12-26 NOTE — Patient Instructions (Signed)
Kristen Lang   Feb 26, 1961 MRN: [id number]    Procedure Date: 02/11/2019 Time to register: 7:30 am Place to register: [facility] Procedure Time: 8:30 am Scheduled provider: Dr. Oneida Alar  PREPARATION FOR COLONOSCOPY WITH TRI-LYTE SPLIT PREP  Please notify us immediately if you are diabetic, take iron supplements, or if you are on Coumadin or any other blood thinners.    You will need to purchase 1 fleet enema and 1 box of Bisacodyl 47m tablets.   1 DAY BEFORE PROCEDURE:  DATE: 02/10/2019  DAY: Sunday Continue clear liquids the entire day - NO SOLID FOOD.    At 2:00 pm:  Take 2 Bisacodyl tablets.   At 4:00pm:  Start drinking your solution. Make sure you mix well per instructions on the bottle. Try to drink 1 (one) 8 ounce glass every 10-15 minutes until you have consumed HALF the jug. You should complete by 6:00pm.You must keep the left over solution refrigerated until completed next day.  Continue clear liquids. You must drink plenty of clear liquids to prevent dehyration and kidney failure.     DAY OF PROCEDURE:   DATE: 02/11/2019   DAY: Monday If you take medications for your heart, blood pressure or breathing, you may take these medications.    Five hours before your procedure time @ 3:30 am:  Finish remaining amout of bowel prep, drinking 1 (one) 8 ounce glass every 10-15 minutes until complete. You have two hours to consume remaining prep.   Three hours before your procedure time @ 5:30 am:  Nothing by mouth.   At least one hour before going to the hospital:  Give yourself one Fleet enema. You may take your morning medications with sip of water unless we have instructed otherwise.      Please see below for Dietary Information.  CLEAR LIQUIDS INCLUDE:  Water Jello (NOT red in color)   Ice Popsicles (NOT red in color)   Tea (sugar ok, no milk/cream) Powdered fruit flavored drinks  Coffee (sugar ok, no milk/cream) Gatorade/ Lemonade/ Kool-Aid  (NOT red in  color)   Juice: apple, white grape, white cranberry Soft drinks  Clear bullion, consomme, broth (fat free beef/chicken/vegetable)  Carbonated beverages (any kind)  Strained chicken noodle soup Hard Candy   Remember: Clear liquids are liquids that will allow you to see your fingers on the other side of a clear glass. Be sure liquids are NOT red in color, and not cloudy, but CLEAR.  DO NOT EAT OR DRINK ANY OF THE FOLLOWING:  Dairy products of any kind   Cranberry juice Tomato juice / V8 juice   Grapefruit juice Orange juice     Red grape juice  Do not eat any solid foods, including such foods as: cereal, oatmeal, yogurt, fruits, vegetables, creamed soups, eggs, bread, crackers, pureed foods in a blender, etc.   HELPFUL HINTS FOR DRINKING PREP SOLUTION:   Make sure prep is extremely cold. Mix and refrigerate the the morning of the prep. You may also put in the freezer.   You may try mixing some Crystal Light or Country Time Lemonade if you prefer. Mix in small amounts; add more if necessary.  Try drinking through a straw  Rinse mouth with water or a mouthwash between glasses, to remove after-taste.  Try sipping on a cold beverage /ice/ popsicles between glasses of prep.  Place a piece of sugar-free hard candy in mouth between glasses.  If you become nauseated, try consuming smaller amounts, or stretch out  the time between glasses. Stop for 30-60 minutes, then slowly start back drinking.        OTHER INSTRUCTIONS  You will need a responsible adult at least 56 years of age to accompany you and drive you home. This person must remain in the waiting room during your procedure. The hospital will cancel your procedure if you do not have a responsible adult with you.   1. Wear loose fitting clothing that is easily removed. 2. Leave jewelry and other valuables at home.  3. Remove all body piercing jewelry and leave at home. 4. Total time from sign-in until discharge is approximately  2-3 hours. 5. You should go home directly after your procedure and rest. You can resume normal activities the day after your procedure. 6. The day of your procedure you should not:  Drive  Make legal decisions  Operate machinery  Drink alcohol  Return to work   You may call the office (Dept: [phone]) before 5:00pm, or page the doctor on call [phone]) after 5:00pm, for further instructions, if necessary.   Insurance Information YOU WILL NEED TO CHECK WITH YOUR INSURANCE COMPANY FOR THE BENEFITS OF COVERAGE YOU HAVE FOR THIS PROCEDURE.  UNFORTUNATELY, NOT ALL INSURANCE COMPANIES HAVE BENEFITS TO COVER ALL OR PART OF THESE TYPES OF PROCEDURES.  IT IS YOUR RESPONSIBILITY TO CHECK YOUR BENEFITS, HOWEVER, WE WILL BE GLAD TO ASSIST YOU WITH ANY CODES YOUR INSURANCE COMPANY MAY NEED.    PLEASE NOTE THAT MOST INSURANCE COMPANIES WILL NOT COVER A SCREENING COLONOSCOPY FOR PEOPLE UNDER THE AGE OF 50  IF YOU HAVE BCBS INSURANCE, YOU MAY HAVE BENEFITS FOR A SCREENING COLONOSCOPY BUT IF POLYPS ARE FOUND THE DIAGNOSIS WILL CHANGE AND THEN YOU MAY HAVE A DEDUCTIBLE THAT WILL NEED TO BE MET. SO PLEASE MAKE SURE YOU CHECK YOUR BENEFITS FOR A SCREENING COLONOSCOPY AS WELL AS A DIAGNOSTIC COLONOSCOPY.

## 2018-12-26 NOTE — Progress Notes (Signed)
Office visit. Needs propofol.

## 2018-12-27 ENCOUNTER — Encounter: Payer: Self-pay | Admitting: *Deleted

## 2018-12-27 NOTE — Progress Notes (Signed)
Letter mailed to pt with appointment information and procedure cancellation.

## 2018-12-27 NOTE — Progress Notes (Signed)
SCHEDULED AND LETTER SENT  °

## 2019-01-24 NOTE — Progress Notes (Addendum)
REVIEWED-NO ADDITIONAL RECOMMENDATIONS.  Referring Provider: Alfonse Flavors, MD Primary Care Physician:  [facility] Primary Gastroenterologist:  Dr. Oneida Alar  Chief Complaint  Patient presents with  . Consult    TCS. never had done prior    HPI:   Kristen Lang is a 58 y.o. female presenting today at the request of Zhou-Talbert, Elwyn Lade, MD for consult colonoscopy.   Today she states she has no concerns. No abdominal pain, nausea, vomiting, dysphagia, heartburn, or acid reflux. BMs daily. Occasional constipation, depends on what she eats. Steak for example will cause constipation. Otherwise, stools are soft and formed. No diarrhea. No blood in the stool. No black stools.  No unintentional weight loss.  Occasional ibuprofen.   2-3 12 oz beer a day.   Dilaudid makes her break out. Hydrocodone, "makes me crazy>" Doesn't want any medications like this.   No prior colonoscopy.   Denies fever, chills, lightheadedness, dizziness, presyncope, or syncope.  Denies chest pain, heart palpitations, shortness of breath, or cough.  Past Medical History:  Diagnosis Date  . Allergy     Past Surgical History:  Procedure Laterality Date  . ABDOMINAL HYSTERECTOMY      Current Outpatient Medications  Medication Sig Dispense Refill  . Ascorbic Acid (VITAMIN C) 1000 MG tablet Take 1,000 mg by mouth once a week.    . Cholecalciferol (VITAMIN D3) 125 MCG (5000 UT) CAPS Take by mouth once a week.    Marland Kitchen Cod Liver Oil CAPS Take by mouth daily.    . Vitamin A 2400 MCG (8000 UT) CAPS Take by mouth once a week.     No current facility-administered medications for this visit.     Allergies as of 01/25/2019 - Review Complete 01/25/2019  Allergen Reaction Noted  . Dilaudid [hydromorphone hcl]  08/06/2012  . Hydrocodone  08/06/2012    Family History  Problem Relation Age of Onset  . Diabetes Mother   . Hypertension Mother   . Cancer Father   .  Hypertension Sister   . Heart disease Sister   . Hypertension Brother   . Diabetes Brother   . Cancer Maternal Grandfather   . Colon cancer Paternal Grandfather        after age 80  . Hypertension Sister   . Hypertension Sister   . Hypertension Sister   . Hypertension Brother   . Heart disease Brother   . Hypertension Brother   . Hypertension Brother   . Drug abuse Brother     Social History   Socioeconomic History  . Marital status: Legally Separated    Spouse name: Not on file  . Number of children: Not on file  . Years of education: Not on file  . Highest education level: Not on file  Occupational History  . Not on file  Social Needs  . Financial resource strain: Not on file  . Food insecurity    Worry: Not on file    Inability: Not on file  . Transportation needs    Medical: Not on file    Non-medical: Not on file  Tobacco Use  . Smoking status: Current Every Day Smoker    Packs/day: 0.25    Years: 9.00    Pack years: 2.25    Types: Cigarettes  . Smokeless tobacco: Never Used  Substance and Sexual Activity  . Alcohol use: Yes    Comment: 2-3 beer a day  . Drug use: No  . Sexual activity: Yes  Birth control/protection: Surgical  Lifestyle  . Physical activity    Days per week: Not on file    Minutes per session: Not on file  . Stress: Not on file  Relationships  . Social Herbalist on phone: Not on file    Gets together: Not on file    Attends religious service: Not on file    Active member of club or organization: Not on file    Attends meetings of clubs or organizations: Not on file    Relationship status: Not on file  . Intimate partner violence    Fear of current or ex partner: Not on file    Emotionally abused: Not on file    Physically abused: Not on file    Forced sexual activity: Not on file  Other Topics Concern  . Not on file  Social History Narrative  . Not on file    Review of Systems: Gen: See HPI HEENT: Denies cold  or flulike symptoms. CV: Denies peripheral edema.  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  Derm: Denies rash Psych: Denies depression, anxiety Heme: Denies bruising, bleeding.   Physical Exam: BP 137/84   Pulse 61   Temp 97.6 F (36.4 C) (Oral)   Ht 5\' 7"  (1.702 m)   Wt 158 lb 12.8 oz (72 kg)   BMI 24.87 kg/m  General:   Alert and oriented. Pleasant and cooperative. Well-nourished and well-developed.  Head:  Normocephalic and atraumatic. Eyes:  Without icterus, sclera clear and conjunctiva pink.  Ears:  Normal auditory acuity. Nose:  No deformity, discharge,  or lesions. Lungs:  Clear to auscultation bilaterally. No wheezes, rales, or rhonchi. No distress.  Heart:  S1, S2 present without murmurs appreciated.  Abdomen:  +BS, soft, non-tender and non-distended. No HSM noted. No guarding or rebound. No masses appreciated.  Rectal:  Deferred  Msk:  Symmetrical without gross deformities. Normal posture. Extremities:  Without edema. Neurologic:  Alert and  oriented x4;  grossly normal neurologically. Skin:  Intact without significant lesions or rashes. Psych:  Alert and cooperative. Normal mood and affect.

## 2019-01-25 ENCOUNTER — Ambulatory Visit (INDEPENDENT_AMBULATORY_CARE_PROVIDER_SITE_OTHER): Admitting: Gastroenterology

## 2019-01-25 ENCOUNTER — Other Ambulatory Visit: Payer: Self-pay

## 2019-01-25 ENCOUNTER — Other Ambulatory Visit: Payer: Self-pay | Admitting: *Deleted

## 2019-01-25 ENCOUNTER — Encounter: Payer: Self-pay | Admitting: *Deleted

## 2019-01-25 ENCOUNTER — Encounter: Payer: Self-pay | Admitting: Gastroenterology

## 2019-01-25 DIAGNOSIS — Z1211 Encounter for screening for malignant neoplasm of colon: Secondary | ICD-10-CM

## 2019-01-25 MED ORDER — PEG 3350-KCL-NA BICARB-NACL 420 G PO SOLR: 4000.0000 mL | Freq: Once | ORAL | 0 refills | Status: AC

## 2019-01-25 NOTE — Patient Instructions (Addendum)
We will get you scheduled for a colonoscopy with Dr. Oneida Alar in the near future.  We will follow-up with you after your procedure as recommended at the time of the colonoscopy.  If you have any questions or concerns, do not hesitate to call.  Aliene Altes, PA-C [facility]

## 2019-01-25 NOTE — Assessment & Plan Note (Addendum)
58 year old with no significant past medical history presenting to schedule her first ever colonoscopy.  No significant upper or lower GI symptoms.  No alarm symptoms.  Past family history significant for paternal grandfather with colon cancer but diagnosed after age 76.  She does drink 2-3 12 ounce of beer a day.  Reports allergy to Dilaudid and hydrocodone.  Proceed with TCS with propofol with Dr. fields in the near future. The risks, benefits, and alternatives have been discussed in detail with patient. They have stated understanding and desire to proceed.  Propofol due to regular alcohol use.  Follow-up as recommended at the time of TCS.

## 2019-01-27 NOTE — Progress Notes (Signed)
CC'ED TO PCP 

## 2019-01-28 ENCOUNTER — Encounter: Payer: Self-pay | Admitting: *Deleted

## 2019-02-08 ENCOUNTER — Other Ambulatory Visit (HOSPITAL_COMMUNITY)

## 2019-02-13 IMAGING — MG DIGITAL SCREENING BILATERAL MAMMOGRAM WITH CAD
4 series · 4 of 4 positions shown · non-contrast
Comparison: Previous exam(s).

CLINICAL DATA: Screening.

EXAM:
DIGITAL SCREENING BILATERAL MAMMOGRAM WITH CAD

[R MLO]
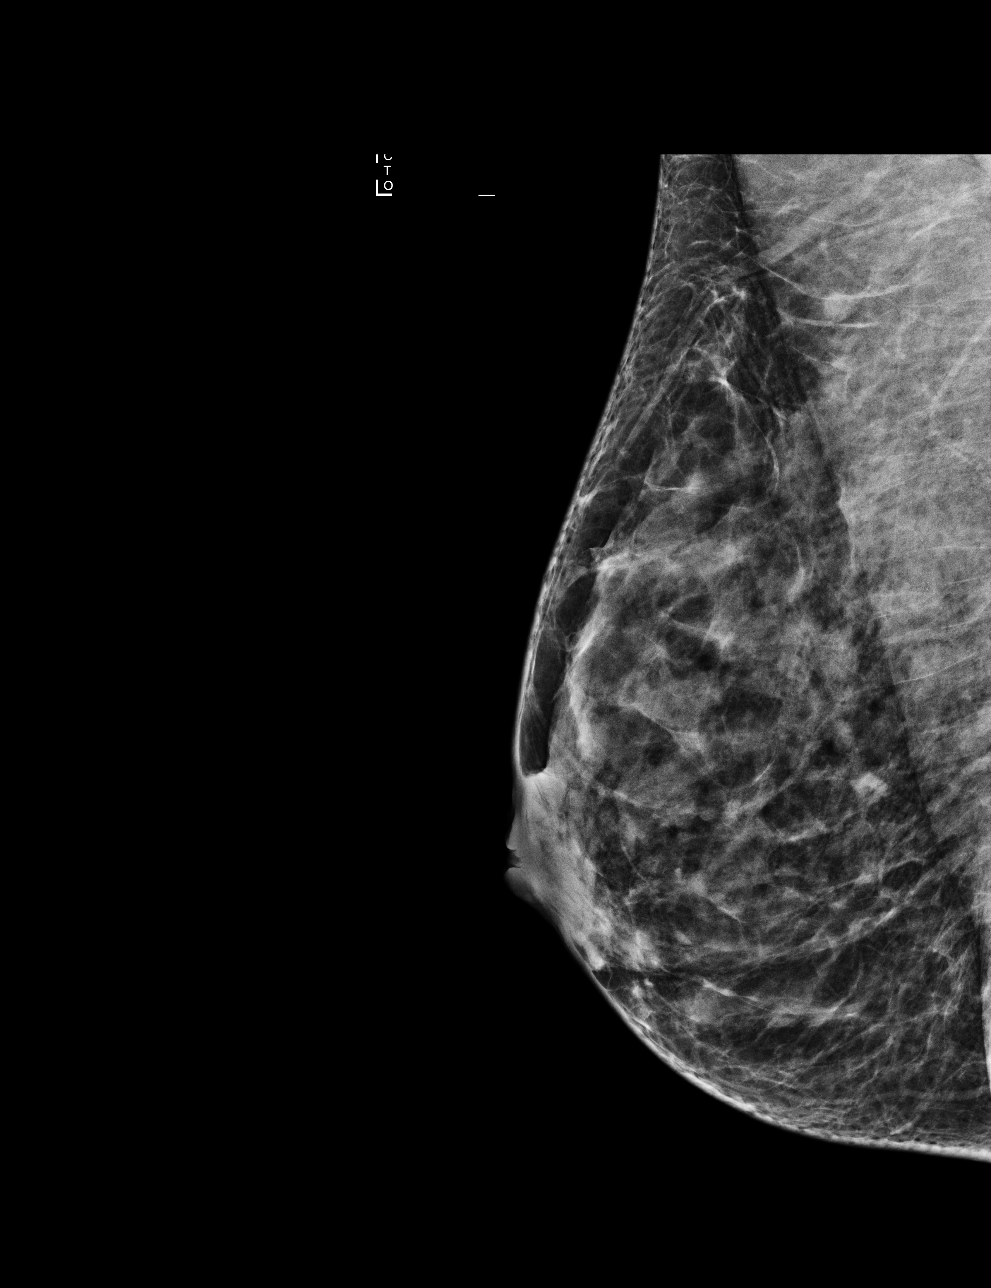

[L CC]
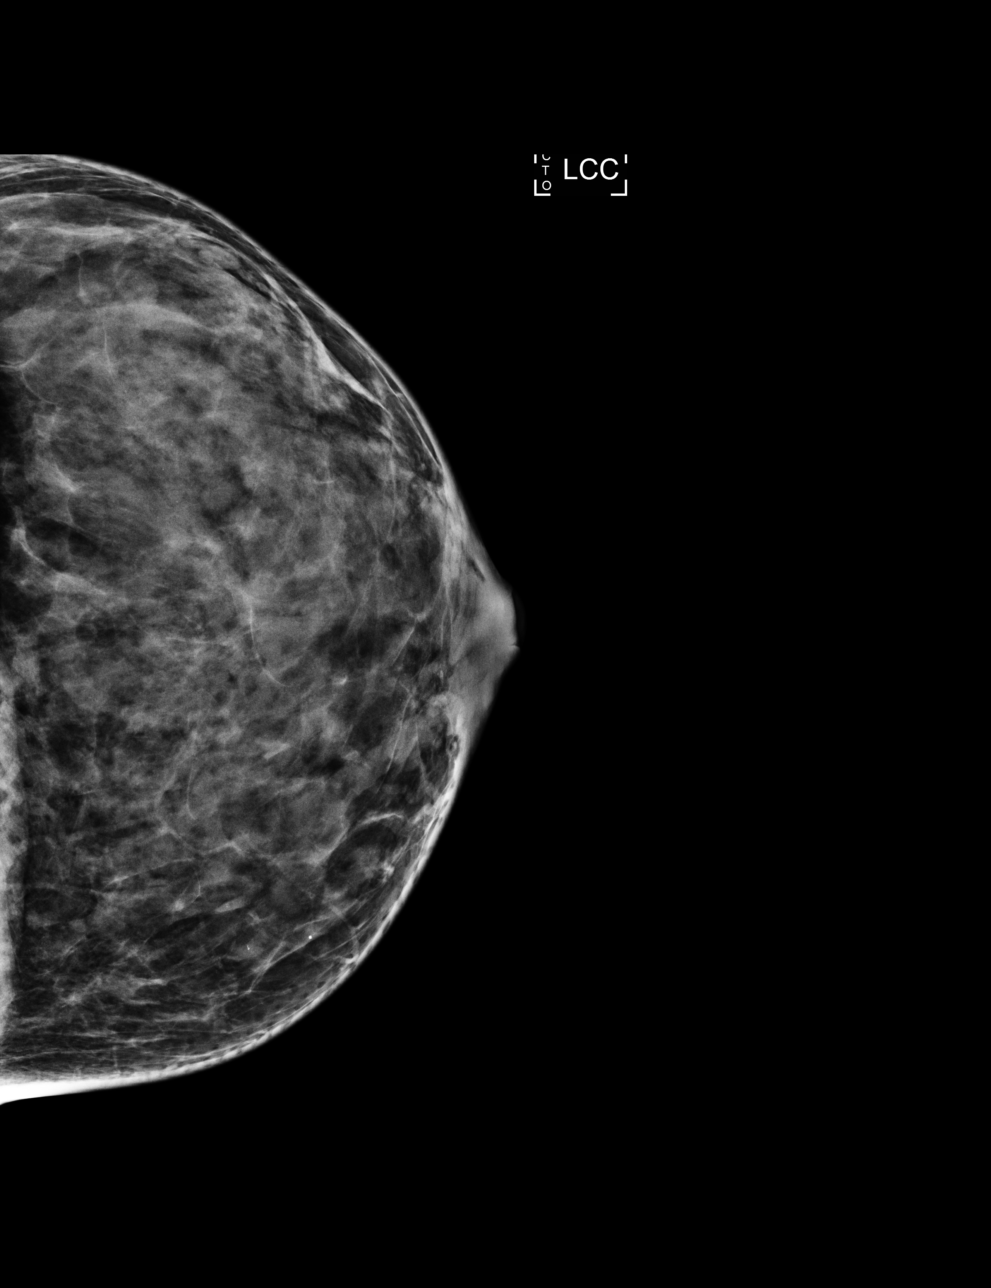

[L MLO]
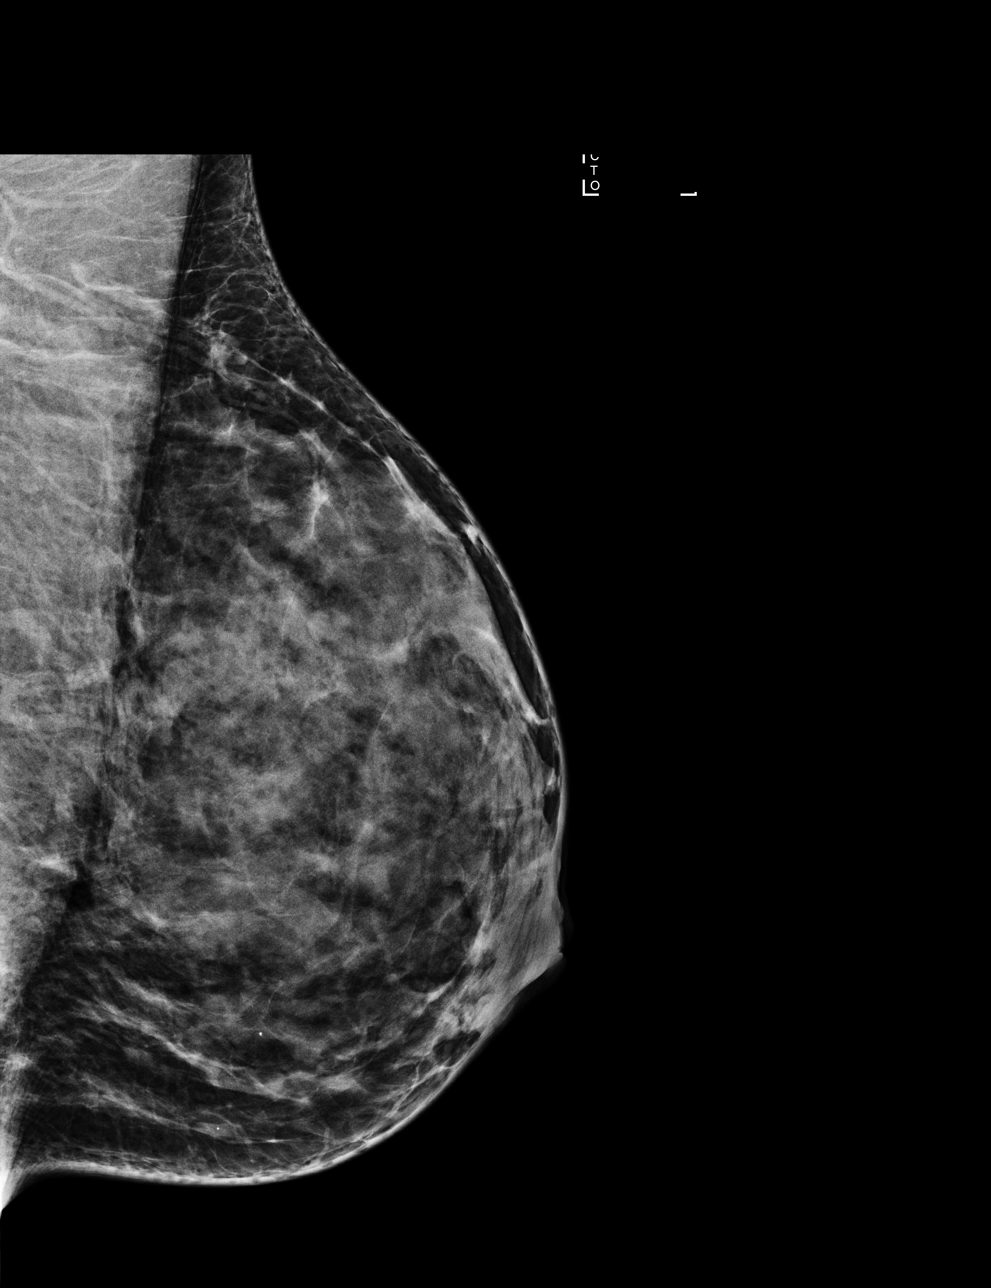

[R CC]
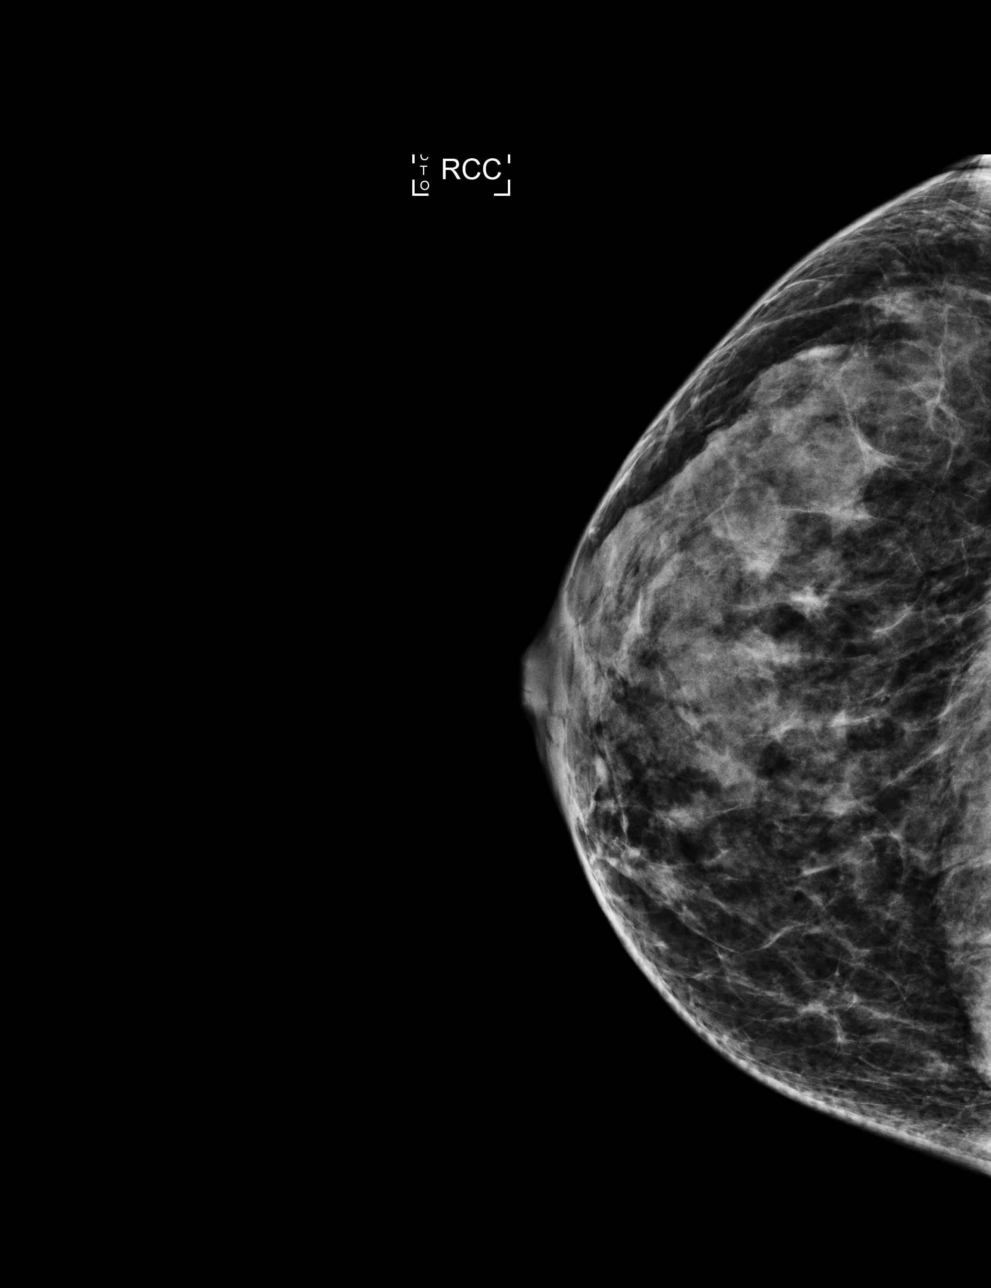

[4 of 4 positions shown; findings below may reference images not displayed]

ACR Breast Density Category c: The breast tissue is heterogeneously
dense, which may obscure small masses.
FINDINGS: There are no findings suspicious for malignancy. Images were
processed with CAD.
IMPRESSION: No mammographic evidence of malignancy. A result letter of this
screening mammogram will be mailed directly to the patient.

RECOMMENDATION:
Screening mammogram in one year. (Code:[id number])

BI-RADS CATEGORY  1: Negative.

## 2019-04-10 ENCOUNTER — Telehealth: Payer: Self-pay | Admitting: Gastroenterology

## 2019-04-10 NOTE — Telephone Encounter (Signed)
No, sinus medications will not affect her COVID test.

## 2019-04-10 NOTE — Telephone Encounter (Signed)
Forwarding to [employer], PA to advise!

## 2019-04-10 NOTE — Telephone Encounter (Signed)
Pt is scheduled a covid test on 04/12/2019 and she is having problems with her sinuses and wanted to know if it would affect the test if she took sinus medications. She said the hospital told her to call us. Please advise. [phone]

## 2019-04-11 ENCOUNTER — Other Ambulatory Visit: Payer: Self-pay

## 2019-04-11 ENCOUNTER — Encounter (HOSPITAL_COMMUNITY): Payer: Self-pay

## 2019-04-11 NOTE — Telephone Encounter (Signed)
Pt is aware.  

## 2019-04-12 ENCOUNTER — Other Ambulatory Visit (HOSPITAL_COMMUNITY)
Admission: RE | Admit: 2019-04-12 | Discharge: 2019-04-12 | Disposition: A | Discharge status: Home / Self Care | Source: Ambulatory Visit | Attending: Gastroenterology | Admitting: Gastroenterology

## 2019-04-12 ENCOUNTER — Encounter (HOSPITAL_COMMUNITY)
Admission: RE | Admit: 2019-04-12 | Discharge: 2019-04-12 | Disposition: A | Discharge status: Home / Self Care | Source: Ambulatory Visit | Attending: Gastroenterology | Admitting: Gastroenterology

## 2019-04-12 DIAGNOSIS — Z20828 Contact with and (suspected) exposure to other viral communicable diseases: Secondary | ICD-10-CM | POA: Insufficient documentation

## 2019-04-12 DIAGNOSIS — Z01812 Encounter for preprocedural laboratory examination: Secondary | ICD-10-CM | POA: Diagnosis present

## 2019-04-12 LAB — SARS CORONAVIRUS 2 (TAT 6-24 HRS): SARS Coronavirus 2: NEGATIVE

## 2019-04-16 ENCOUNTER — Ambulatory Visit (HOSPITAL_COMMUNITY)
Admission: RE | Admit: 2019-04-16 | Discharge: 2019-04-16 | Disposition: A | Discharge status: Home / Self Care | Attending: Gastroenterology | Admitting: Gastroenterology

## 2019-04-16 ENCOUNTER — Ambulatory Visit (HOSPITAL_COMMUNITY): Admitting: Anesthesiology

## 2019-04-16 ENCOUNTER — Other Ambulatory Visit: Payer: Self-pay

## 2019-04-16 ENCOUNTER — Encounter (HOSPITAL_COMMUNITY): Payer: Self-pay | Admitting: *Deleted

## 2019-04-16 ENCOUNTER — Encounter (HOSPITAL_COMMUNITY)
Admission: RE | Disposition: A | Discharge status: Home / Self Care | Payer: Self-pay | Source: Home / Self Care | Attending: Gastroenterology

## 2019-04-16 DIAGNOSIS — Z885 Allergy status to narcotic agent status: Secondary | ICD-10-CM | POA: Insufficient documentation

## 2019-04-16 DIAGNOSIS — F17210 Nicotine dependence, cigarettes, uncomplicated: Secondary | ICD-10-CM | POA: Diagnosis not present

## 2019-04-16 DIAGNOSIS — K648 Other hemorrhoids: Secondary | ICD-10-CM | POA: Insufficient documentation

## 2019-04-16 DIAGNOSIS — Q438 Other specified congenital malformations of intestine: Secondary | ICD-10-CM | POA: Diagnosis not present

## 2019-04-16 DIAGNOSIS — K644 Residual hemorrhoidal skin tags: Secondary | ICD-10-CM | POA: Diagnosis not present

## 2019-04-16 DIAGNOSIS — Z1211 Encounter for screening for malignant neoplasm of colon: Secondary | ICD-10-CM

## 2019-04-16 HISTORY — PX: COLONOSCOPY WITH PROPOFOL: SHX5780

## 2019-04-16 SURGERY — COLONOSCOPY WITH PROPOFOL
Anesthesia: General

## 2019-04-16 MED ORDER — PROPOFOL 500 MG/50ML IV EMUL
INTRAVENOUS | Status: DC | PRN
  Administered 2019-04-16: 150 ug/kg/min via INTRAVENOUS

## 2019-04-16 MED ORDER — PROPOFOL 10 MG/ML IV BOLUS
INTRAVENOUS | Status: DC | PRN
  Administered 2019-04-16: 6 ug via INTRAVENOUS

## 2019-04-16 MED ORDER — LACTATED RINGERS IV SOLN
Freq: Once | INTRAVENOUS | Status: AC
  Administered 2019-04-16: 10:00:00 via INTRAVENOUS

## 2019-04-16 MED ORDER — LACTATED RINGERS IV SOLN
INTRAVENOUS | Status: DC | PRN
  Administered 2019-04-16: 10:00:00 via INTRAVENOUS

## 2019-04-16 MED ORDER — LIDOCAINE HCL (CARDIAC) PF 100 MG/5ML IV SOSY
PREFILLED_SYRINGE | INTRAVENOUS | Status: DC | PRN
  Administered 2019-04-16: 40 mg via INTRATRACHEAL

## 2019-04-16 NOTE — Op Note (Signed)
[facility] Patient Name: Kristen Lang Procedure Date: 04/16/2019 9:35 AM MRN: [id number] Date of Birth: [birth year] Attending MD: Barney Drain MD, MD CSN: [id number] Age: 58 Admit Type: Outpatient Procedure:                Colonoscopy, SCREENING Indications:              Screening for colorectal malignant neoplasm Providers:                Barney Drain MD, MD, Otis Peak B. Sharon Seller, RN, Aram Candela Referring MD:             Abran Richard Medicines:                Propofol per Anesthesia Complications:            No immediate complications. Estimated Blood Loss:     Estimated blood loss: none. Procedure:                Pre-Anesthesia Assessment:                           - Prior to the procedure, a History and Physical                            was performed, and patient medications and                            allergies were reviewed. The patient's tolerance of                            previous anesthesia was also reviewed. The risks                            and benefits of the procedure and the sedation                            options and risks were discussed with the patient.                            All questions were answered, and informed consent                            was obtained. Prior Anticoagulants: The patient has                            taken no previous anticoagulant or antiplatelet                            agents. ASA Grade Assessment: II - A patient with                            mild systemic disease. After reviewing the risks  and benefits, the patient was deemed in                            satisfactory condition to undergo the procedure.                            After obtaining informed consent, the colonoscope                            was passed under direct vision. Throughout the                            procedure, the patient's blood pressure, pulse, and          oxygen saturations were monitored continuously. The                            [id number]) scope was introduced through                            the anus and advanced to the the cecum, identified                            by appendiceal orifice and ileocecal valve. The                            colonoscopy was somewhat difficult due to a                            tortuous colon. Successful completion of the                            procedure was aided by straightening and shortening                            the scope to obtain bowel loop reduction and                            COLOWRAP. The patient tolerated the procedure well.                            The quality of the bowel preparation was excellent.                            The ileocecal valve, appendiceal orifice, and                            rectum were photographed. Scope In: 10:10:23 AM Scope Out: 10:28:51 AM Scope Withdrawal Time: 0 hours 15 minutes 29 seconds  Total Procedure Duration: 0 hours 18 minutes 28 seconds  Findings:      The recto-sigmoid colon and sigmoid colon were mildly tortuous.      External and internal hemorrhoids were found.      The exam was otherwise without abnormality. Impression:               -  MILDLY Tortuous colon.                           - External and internal hemorrhoids.                           - The examination was otherwise normal. Moderate Sedation:      Per Anesthesia Care Recommendation:           - Patient has a contact number available for                            emergencies. The signs and symptoms of potential                            delayed complications were discussed with the                            patient. Return to normal activities tomorrow.                            Written discharge instructions were provided to the                            patient.                           - High fiber diet.                           - Continue  present medications.                           - Repeat colonoscopy in 10 years for surveillance. Procedure Code(s):        --- Professional ---                           [phone], Colonoscopy, flexible; diagnostic, including                            collection of specimen(s) by brushing or washing,                            when performed (separate procedure) Diagnosis Code(s):        --- Professional ---                           Z12.11, Encounter for screening for malignant                            neoplasm of colon                           K64.8, Other hemorrhoids                           Q43.8, Other specified congenital malformations of  intestine CPT copyright 2019 American Medical Association. All rights reserved. The codes documented in this report are preliminary and upon coder review may  be revised to meet current compliance requirements. Barney Drain, MD Barney Drain MD, MD 04/16/2019 10:51:43 AM This report has been signed electronically. Number of Addenda: 0

## 2019-04-16 NOTE — Transfer of Care (Signed)
Immediate Anesthesia Transfer of Care Note  Patient: Kristen Lang  Procedure(s) Performed: COLONOSCOPY WITH PROPOFOL (N/A )  Patient Location: PACU  Anesthesia Type:General  Level of Consciousness: awake, alert  and oriented  Airway & Oxygen Therapy: Patient Spontanous Breathing  Post-op Assessment: Report given to RN, Post -op Vital signs reviewed and stable and Patient moving all extremities X 4  Post vital signs: Reviewed and stable  Last Vitals:  Vitals Value Taken Time  BP    Temp    Pulse 74 04/16/19 1037  Resp 16 04/16/19 1037  SpO2 97 % 04/16/19 1037  Vitals shown include unvalidated device data.  Last Pain:  Vitals:   04/16/19 1001  TempSrc:   PainSc: 0-No pain      Patients Stated Pain Goal: 3 ([id number])  Complications: No apparent anesthesia complications

## 2019-04-16 NOTE — Discharge Instructions (Signed)
You have internal and external hemorrhoids. YOU DID NOT HAVE ANY POLYPS.   DRINK WATER TO KEEP YOUR URINE LIGHT YELLOW.  FOLLOW A HIGH FIBER DIET. AVOID ITEMS THAT CAUSE BLOATING. SEE INFO BELOW.  USE PREPARATION H FOUR TIMES  A DAY IF NEEDED TO RELIEVE RECTAL PAIN/PRESSURE/BLEEDING.  Next colonoscopy in 10 years.  Colonoscopy Care After Read the instructions outlined below and refer to this sheet in the next week. These discharge instructions provide you with general information on caring for yourself after you leave the hospital. While your treatment has been planned according to the most current medical practices available, unavoidable complications occasionally occur. If you have any problems or questions after discharge, call DR. Demitrious Mccannon, [phone].  ACTIVITY  You may resume your regular activity, but move at a slower pace for the next 24 hours.   Take frequent rest periods for the next 24 hours.   Walking will help get rid of the air and reduce the bloated feeling in your belly (abdomen).   No driving for 24 hours (because of the medicine (anesthesia) used during the test).   You may shower.   Do not sign any important legal documents or operate any machinery for 24 hours (because of the anesthesia used during the test).    NUTRITION  Drink plenty of fluids.   You may resume your normal diet as instructed by your doctor.   Begin with a light meal and progress to your normal diet. Heavy or fried foods are harder to digest and may make you feel sick to your stomach (nauseated).   Avoid alcoholic beverages for 24 hours or as instructed.    MEDICATIONS  You may resume your normal medications.   WHAT YOU CAN EXPECT TODAY  Some feelings of bloating in the abdomen.   Passage of more gas than usual.   Spotting of blood in your stool or on the toilet paper  .  IF YOU HAD POLYPS REMOVED DURING THE COLONOSCOPY:  Eat a soft diet IF YOU HAVE NAUSEA, BLOATING,  ABDOMINAL PAIN, OR VOMITING.    FINDING OUT THE RESULTS OF YOUR TEST Not all test results are available during your visit. DR. Oneida Alar WILL CALL YOU WITHIN 14 DAYS OF YOUR PROCEDUE WITH YOUR RESULTS. Do not assume everything is normal if you have not heard from DR. Shanai Lartigue, CALL HER OFFICE AT [phone].  SEEK IMMEDIATE MEDICAL ATTENTION AND CALL THE OFFICE: [phone] IF:  You have more than a spotting of blood in your stool.   Your belly is swollen (abdominal distention).   You are nauseated or vomiting.   You have a temperature over 101F.   You have abdominal pain or discomfort that is severe or gets worse throughout the day.  High-Fiber Diet A high-fiber diet changes your normal diet to include more whole grains, legumes, fruits, and vegetables. Changes in the diet involve replacing refined carbohydrates with unrefined foods. The calorie level of the diet is essentially unchanged. The Dietary Reference Intake (recommended amount) for adult males is 38 grams per day. For adult females, it is 25 grams per day. Pregnant and lactating women should consume 28 grams of fiber per day. Fiber is the intact part of a plant that is not broken down during digestion. Functional fiber is fiber that has been isolated from the plant to provide a beneficial effect in the body.  PURPOSE  Increase stool bulk.   Ease and regulate bowel movements.   Lower cholesterol.   REDUCE RISK OF  COLON CANCER  INDICATIONS THAT YOU NEED MORE FIBER  Constipation and hemorrhoids.   Uncomplicated diverticulosis (intestine condition) and irritable bowel syndrome.   Weight management.   As a protective measure against hardening of the arteries (atherosclerosis), diabetes, and cancer.   GUIDELINES FOR INCREASING FIBER IN THE DIET  Start adding fiber to the diet slowly. A gradual increase of about 5 more grams (2 servings of most fruits or vegetables) per day is best. Too rapid an increase in fiber may  result in constipation, flatulence, and bloating.   Drink enough water and fluids to keep your urine clear or pale yellow. Water, juice, or caffeine-free drinks are recommended. Not drinking enough fluid may cause constipation.   Eat a variety of high-fiber foods rather than one type of fiber.   Try to increase your intake of fiber through using high-fiber foods rather than fiber pills or supplements that contain small amounts of fiber.   The goal is to change the types of food eaten. Do not supplement your present diet with high-fiber foods, but replace foods in your present diet.

## 2019-04-16 NOTE — H&P (Signed)
Primary Care Physician:  [facility]ologist:  Dr. Oneida Alar  Pre-Procedure History & Physical: HPI:  Kristen Lang is a 58 y.o. female here for [place].  Past Medical History:  Diagnosis Date  . Allergy     Past Surgical History:  Procedure Laterality Date  . ABDOMINAL HYSTERECTOMY      Prior to Admission medications   Medication Sig Start Date End Date Taking? Authorizing Provider  Ascorbic Acid (VITAMIN C) 1000 MG tablet Take 1,000 mg by mouth once a week.   Yes [provider]  Cholecalciferol (VITAMIN D3) 125 MCG (5000 UT) CAPS Take 5,000 Units by mouth once a week.    Yes [provider]  Cod Liver Oil CAPS Take 1 capsule by mouth daily.    Yes [provider]  Vitamin A 2400 MCG (8000 UT) CAPS Take 8,000 Units by mouth once a week.    Yes [provider]    Allergies as of 01/25/2019 - Review Complete 01/25/2019  Allergen Reaction Noted  . Dilaudid [hydromorphone hcl]  08/06/2012  . Hydrocodone  08/06/2012    Family History  Problem Relation Age of Onset  . Diabetes Mother   . Hypertension Mother   . Cancer Father   . Hypertension Sister   . Heart disease Sister   . Hypertension Brother   . Diabetes Brother   . Cancer Maternal Grandfather   . Colon cancer Paternal Grandfather        after age 39  . Hypertension Sister   . Hypertension Sister   . Hypertension Sister   . Hypertension Brother   . Heart disease Brother   . Hypertension Brother   . Hypertension Brother   . Drug abuse Brother     Social History   Socioeconomic History  . Marital status: Legally Separated    Spouse name: Not on file  . Number of children: Not on file  . Years of education: Not on file  . Highest education level: Not on file  Occupational History  . Not on file  Social Needs  . Financial resource strain: Not on file  . Food insecurity    Worry: Not on file    Inability:  Not on file  . Transportation needs    Medical: Not on file    Non-medical: Not on file  Tobacco Use  . Smoking status: Current Every Day Smoker    Packs/day: 0.25    Years: 9.00    Pack years: 2.25    Types: Cigarettes  . Smokeless tobacco: Never Used  Substance and Sexual Activity  . Alcohol use: Yes    Comment: 2-3 12 oz beer a day  . Drug use: No  . Sexual activity: Yes    Birth control/protection: Surgical  Lifestyle  . Physical activity    Days per week: Not on file    Minutes per session: Not on file  . Stress: Not on file  Relationships  . Social Herbalist on phone: Not on file    Gets together: Not on file    Attends religious service: Not on file    Active member of club or organization: Not on file    Attends meetings of clubs or organizations: Not on file    Relationship status: Not on file  . Intimate partner violence    Fear of current or ex partner: Not on file    Emotionally abused: Not on file  Physically abused: Not on file    Forced sexual activity: Not on file  Other Topics Concern  . Not on file  Social History Narrative  . Not on file    Review of Systems: See HPI, otherwise negative ROS   Physical Exam: BP 127/75   Pulse 60   Temp 98.5 F (36.9 C) (Oral)   Resp 16   SpO2 99%  General:   Alert,  pleasant and cooperative in NAD Head:  Normocephalic and atraumatic. Neck:  Supple; Lungs:  Clear throughout to auscultation.    Heart:  Regular rate and rhythm. Abdomen:  Soft, nontender and nondistended. Normal bowel sounds, without guarding, and without rebound.   Neurologic:  Alert and  oriented x4;  grossly normal neurologically.  Impression/Plan:    SCREENING  Plan:  1. TCS TODAY DISCUSSED PROCEDURE, BENEFITS, & RISKS: < 1% chance of medication reaction, bleeding, perforation, ASPIRATION, or rupture of spleen/liver requiring surgery to fix it and missed polyps < 1 cm 10-20% of the time.

## 2019-04-16 NOTE — Anesthesia Postprocedure Evaluation (Signed)
Anesthesia Post Note  Patient: Kristen Lang  Procedure(s) Performed: COLONOSCOPY WITH PROPOFOL (N/A )  Patient location during evaluation: Endoscopy Anesthesia Type: General Level of consciousness: awake and alert Pain management: pain level controlled Vital Signs Assessment: post-procedure vital signs reviewed and stable Respiratory status: spontaneous breathing, nonlabored ventilation, respiratory function stable and patient connected to nasal cannula oxygen Cardiovascular status: blood pressure returned to baseline and stable Postop Assessment: no apparent nausea or vomiting Anesthetic complications: no     Last Vitals:  Vitals:   04/16/19 1035 04/16/19 1055  BP: 108/75 137/70  Pulse: 70 64  Resp: 17 18  Temp: 36.6 C   SpO2: 96% 97%    Last Pain:  Vitals:   04/16/19 1055  TempSrc:   PainSc: 0-No pain                 Adair Patter Grainger Mccarley

## 2019-04-16 NOTE — Anesthesia Preprocedure Evaluation (Signed)
Anesthesia Evaluation  Patient identified by MRN, date of birth, ID band Patient awake    Reviewed: Allergy & Precautions, NPO status , Patient's Chart, lab work & pertinent test results  Airway Mallampati: II  TM Distance: >3 FB Neck ROM: Full    Dental  (+) Missing, Caps, Partial Upper, Partial Lower   Pulmonary Current Smoker and Patient abstained from smoking.,  Sinus    Pulmonary exam normal breath sounds clear to auscultation       Cardiovascular Exercise Tolerance: Good negative cardio ROS Normal cardiovascular exam Rhythm:Regular Rate:Normal     Neuro/Psych negative neurological ROS  negative psych ROS   GI/Hepatic negative GI ROS, Neg liver ROS,   Endo/Other  negative endocrine ROS  Renal/GU negative Renal ROS     Musculoskeletal   Abdominal   Peds  Hematology negative hematology ROS (+)   Anesthesia Other Findings   Reproductive/Obstetrics                             Anesthesia Physical Anesthesia Plan  ASA: II  Anesthesia Plan: General   Post-op Pain Management:    Induction: Intravenous  PONV Risk Score and Plan: TIVA  Airway Management Planned: Nasal Cannula, Natural Airway and Simple Face Mask  Additional Equipment:   Intra-op Plan:   Post-operative Plan:   Informed Consent: I have reviewed the patients History and Physical, chart, labs and discussed the procedure including the risks, benefits and alternatives for the proposed anesthesia with the patient or authorized representative who has indicated his/her understanding and acceptance.     Dental advisory given  Plan Discussed with: CRNA  Anesthesia Plan Comments:         Anesthesia Quick Evaluation

## 2019-11-13 ENCOUNTER — Emergency Department (HOSPITAL_COMMUNITY)

## 2019-11-13 ENCOUNTER — Other Ambulatory Visit: Payer: Self-pay

## 2019-11-13 ENCOUNTER — Encounter (HOSPITAL_COMMUNITY): Payer: Self-pay | Admitting: Emergency Medicine

## 2019-11-13 ENCOUNTER — Emergency Department (HOSPITAL_COMMUNITY)
Admission: EM | Admit: 2019-11-13 | Discharge: 2019-11-13 | Disposition: A | Discharge status: Home / Self Care | Attending: Emergency Medicine | Admitting: Emergency Medicine

## 2019-11-13 DIAGNOSIS — R519 Headache, unspecified: Secondary | ICD-10-CM | POA: Diagnosis not present

## 2019-11-13 DIAGNOSIS — R5381 Other malaise: Secondary | ICD-10-CM | POA: Diagnosis not present

## 2019-11-13 DIAGNOSIS — J039 Acute tonsillitis, unspecified: Secondary | ICD-10-CM | POA: Diagnosis not present

## 2019-11-13 DIAGNOSIS — I889 Nonspecific lymphadenitis, unspecified: Secondary | ICD-10-CM | POA: Insufficient documentation

## 2019-11-13 DIAGNOSIS — M542 Cervicalgia: Secondary | ICD-10-CM | POA: Diagnosis not present

## 2019-11-13 DIAGNOSIS — F17210 Nicotine dependence, cigarettes, uncomplicated: Secondary | ICD-10-CM | POA: Insufficient documentation

## 2019-11-13 DIAGNOSIS — H9201 Otalgia, right ear: Secondary | ICD-10-CM | POA: Diagnosis present

## 2019-11-13 LAB — CBC
HCT: 37.1 % (ref 36.0–46.0)
Hemoglobin: 12.2 g/dL (ref 12.0–15.0)
MCH: 31 pg (ref 26.0–34.0)
MCHC: 32.9 g/dL (ref 30.0–36.0)
MCV: 94.2 fL (ref 80.0–100.0)
Platelets: 198 10*3/uL (ref 150–400)
RBC: 3.94 MIL/uL (ref 3.87–5.11)
RDW: 14.2 % (ref 11.5–15.5)
WBC: 15.1 10*3/uL — ABNORMAL HIGH (ref 4.0–10.5)
nRBC: 0 % (ref 0.0–0.2)

## 2019-11-13 LAB — COMPREHENSIVE METABOLIC PANEL
ALT: 16 U/L (ref 0–44)
AST: 26 U/L (ref 15–41)
Albumin: 4.2 g/dL (ref 3.5–5.0)
Alkaline Phosphatase: 71 U/L (ref 38–126)
Anion gap: 9 (ref 5–15)
BUN: 11 mg/dL (ref 6–20)
CO2: 25 mmol/L (ref 22–32)
Calcium: 9 mg/dL (ref 8.9–10.3)
Chloride: 100 mmol/L (ref 98–111)
Creatinine, Ser: 0.82 mg/dL (ref 0.44–1.00)
GFR calc Af Amer: >60 mL/min (ref >60)
GFR calc non Af Amer: >60 mL/min (ref >60)
Glucose, Bld: 123 mg/dL — ABNORMAL HIGH (ref 70–99)
Potassium: 3.7 mmol/L (ref 3.5–5.1)
Sodium: 134 mmol/L — ABNORMAL LOW (ref 135–145)
Total Bilirubin: 0.8 mg/dL (ref 0.3–1.2)
Total Protein: 7.3 g/dL (ref 6.5–8.1)

## 2019-11-13 MED ORDER — SODIUM CHLORIDE 0.9 % IV BOLUS
1000.0000 mL | Freq: Once | INTRAVENOUS | Status: AC
  Administered 2019-11-13: 1000 mL via INTRAVENOUS

## 2019-11-13 MED ORDER — AMOXICILLIN-POT CLAVULANATE 875-125 MG PO TABS: 1.0000 | ORAL_TABLET | Freq: Two times a day (BID) | ORAL | 0 refills | Status: AC

## 2019-11-13 MED ORDER — IOHEXOL 300 MG/ML  SOLN
75.0000 mL | Freq: Once | INTRAMUSCULAR | Status: AC | PRN
  Administered 2019-11-13: 75 mL via INTRAVENOUS

## 2019-11-13 MED ORDER — SODIUM CHLORIDE 0.9 % IV SOLN
3.0000 g | Freq: Once | INTRAVENOUS | Status: AC
  Administered 2019-11-13: 3 g via INTRAVENOUS
  Filled 2019-11-13: qty 8

## 2019-11-13 MED ORDER — FENTANYL CITRATE (PF) 100 MCG/2ML IJ SOLN
25.0000 ug | Freq: Once | INTRAMUSCULAR | Status: AC
  Administered 2019-11-13: 25 ug via INTRAVENOUS
  Filled 2019-11-13: qty 2

## 2019-11-13 NOTE — ED Provider Notes (Signed)
[facility] Emergency Department Provider Note MRN:  [id number]  Arrival date & time: 11/13/19     Chief Complaint   Otalgia (left ear)   History of Present Illness   Kristen Lang is a 59 y.o. year-old female with no pertinent past medical history presenting to the ED with chief complaint of ear pain.  Left ear and neck pain, sore throat, nasal congestion, headache for the past 24 hours.  Progressively worsening.  Worse with palpation to the left lateral neck.  Feels general malaise and fever as well.  No cough, no chest pain, no shortness of breath, no other complaints.  Review of Systems  A complete 10 system review of systems was obtained and all systems are negative except as noted in the HPI and PMH.   Patient's Health History    Past Medical History:  Diagnosis Date  . Allergy     Past Surgical History:  Procedure Laterality Date  . ABDOMINAL HYSTERECTOMY    . COLONOSCOPY WITH PROPOFOL N/A 04/16/2019   Procedure: COLONOSCOPY WITH PROPOFOL;  Surgeon: Danie Binder, MD;  Location: [facility];  Service: Endoscopy;  Laterality: N/A;  10:30am    Family History  Problem Relation Age of Onset  . Diabetes Mother   . Hypertension Mother   . Cancer Father   . Hypertension Sister   . Heart disease Sister   . Hypertension Brother   . Diabetes Brother   . Cancer Maternal Grandfather   . Colon cancer Paternal Grandfather        after age 27  . Hypertension Sister   . Hypertension Sister   . Hypertension Sister   . Hypertension Brother   . Heart disease Brother   . Hypertension Brother   . Hypertension Brother   . Drug abuse Brother     Social History   Socioeconomic History  . Marital status: Legally Separated    Spouse name: Not on file  . Number of children: Not on file  . Years of education: Not on file  . Highest education level: Not on file  Occupational History  . Not on file  Tobacco Use  . Smoking status: Current Every  Day Smoker    Packs/day: 0.25    Years: 9.00    Pack years: 2.25    Types: Cigarettes  . Smokeless tobacco: Never Used  Vaping Use  . Vaping Use: Never used  Substance and Sexual Activity  . Alcohol use: Yes    Comment: 2-3 12 oz beer a day  . Drug use: No  . Sexual activity: Yes    Birth control/protection: Surgical  Other Topics Concern  . Not on file  Social History Narrative  . Not on file   Social Determinants of Health   Financial Resource Strain:   . Difficulty of Paying Living Expenses:   Food Insecurity:   . Worried About Charity fundraiser in the Last Year:   . Arboriculturist in the Last Year:   Transportation Needs:   . Film/video editor (Medical):   Marland Kitchen Lack of Transportation (Non-Medical):   Physical Activity:   . Days of Exercise per Week:   . Minutes of Exercise per Session:   Stress:   . Feeling of Stress :   Social Connections:   . Frequency of Communication with Friends and Family:   . Frequency of Social Gatherings with Friends and Family:   . Attends Religious Services:   . Active  Member of Clubs or Organizations:   . Attends Archivist Meetings:   Marland Kitchen Marital Status:   Intimate Partner Violence:   . Fear of Current or Ex-Partner:   . Emotionally Abused:   Marland Kitchen Physically Abused:   . Sexually Abused:      Physical Exam   Vitals:   11/13/19 0326  BP: (!) 146/73  Pulse: 96  Resp: 18  Temp: (!) 100.5 F (38.1 C)  SpO2: 100%    CONSTITUTIONAL: Well-appearing, NAD NEURO:  Alert and oriented x 3, no focal deficits EYES:  eyes equal and reactive ENT/NECK:  no LAD, no JVD, bilateral tonsillar erythema, left greater than right; tenderness to palpation to the left lateral neck CARDIO: Regular rate, well-perfused, normal S1 and S2 PULM:  CTAB no wheezing or rhonchi GI/GU:  normal bowel sounds, non-distended, non-tender MSK/SPINE:  No gross deformities, no edema SKIN:  no rash, atraumatic PSYCH:  Appropriate speech and  behavior  *Additional and/or pertinent findings included in MDM below  Diagnostic and Interventional Summary    EKG Interpretation  Date/Time:    Ventricular Rate:    PR Interval:    QRS Duration:   QT Interval:    QTC Calculation:   R Axis:     Text Interpretation:        Labs Reviewed  CBC - Abnormal; Notable for the following components:      Result Value   WBC 15.1 (*)    All other components within normal limits  COMPREHENSIVE METABOLIC PANEL - Abnormal; Notable for the following components:   Sodium 134 (*)    Glucose, Bld 123 (*)    All other components within normal limits  CULTURE, BLOOD (SINGLE)    CT Soft Tissue Neck W Contrast  Final Result      Medications  sodium chloride 0.9 % bolus 1,000 mL (1,000 mLs Intravenous New Bag/Given 11/13/19 0525)  fentaNYL (SUBLIMAZE) injection 25 mcg (25 mcg Intravenous Given 11/13/19 0525)  Ampicillin-Sulbactam (UNASYN) 3 g in sodium chloride 0.9 % 100 mL IVPB (3 g Intravenous New Bag/Given 11/13/19 0525)  iohexol (OMNIPAQUE) 300 MG/ML solution 75 mL (75 mLs Intravenous Contrast Given 11/13/19 0600)     Procedures  /  Critical Care Procedures  ED Course and Medical Decision Making  I have reviewed the triage vital signs, the nursing notes, and pertinent available records from the EMR.  Listed above are laboratory and imaging tests that I personally ordered, reviewed, and interpreted and then considered in my medical decision making (see below for details).      CT to exclude deep space neck infection, febrile but otherwise normal vital signs, work-up pending.  Labs reveal leukocytosis, CT imaging is without obvious deep space infection or abscess.  I have some concern for bacterial source of patient's symptoms.  The left tonsil seemed more full than the right, question small peritonsillar abscess.  Will cover with Augmentin, strict return precautions.  Barth Kirks. [place] Small, [place] [email or web address]  Final Clinical Impressions(s) / ED Diagnoses     ICD-10-CM   1. Adenitis  I88.9   2. Tonsillitis  J03.90     ED Discharge Orders         Ordered    amoxicillin-clavulanate (AUGMENTIN) 875-125 MG tablet  Every 12 hours     Discontinue  Reprint     11/13/19 5397           Discharge Instructions Discussed with and Provided  to Patient:     Discharge Instructions     You were evaluated in the Emergency Department and after careful evaluation, we did not find any emergent condition requiring admission or further testing in the hospital.  Your exam/testing today is overall reassuring.  Your symptoms seem to be due to infection of the tonsils and lymph nodes of the neck.  Please take the antibiotics as directed and use Tylenol or Motrin for discomfort.  Please return to the Emergency Department if you experience any worsening of your condition.  We encourage you to follow up with a primary care provider.  Thank you for allowing [place] to be a part of your care.       Maudie Flakes, MD 11/13/19 [phone]

## 2019-11-13 NOTE — Discharge Instructions (Addendum)
You were evaluated in the Emergency Department and after careful evaluation, we did not find any emergent condition requiring admission or further testing in the hospital.  Your exam/testing today is overall reassuring.  Your symptoms seem to be due to infection of the tonsils and lymph nodes of the neck.  Please take the antibiotics as directed and use Tylenol or Motrin for discomfort.  Please return to the Emergency Department if you experience any worsening of your condition.  We encourage you to follow up with a primary care provider.  Thank you for allowing [place] to be a part of your care.

## 2019-11-13 NOTE — ED Triage Notes (Signed)
Patient states left ear pain that started yesterday. Patients also complaining of swelling under her ear.

## 2019-11-18 LAB — CULTURE, BLOOD (SINGLE)
Culture: NO GROWTH
Special Requests: ADEQUATE

## 2020-05-08 ENCOUNTER — Other Ambulatory Visit: Payer: Self-pay

## 2020-05-08 ENCOUNTER — Ambulatory Visit
Admission: EM | Admit: 2020-05-08 | Discharge: 2020-05-08 | Disposition: A | Discharge status: Home / Self Care | Attending: Family Medicine | Admitting: Family Medicine

## 2020-05-08 ENCOUNTER — Encounter: Payer: Self-pay | Admitting: Family Medicine

## 2020-05-08 DIAGNOSIS — M79601 Pain in right arm: Secondary | ICD-10-CM | POA: Diagnosis not present

## 2020-05-08 MED ORDER — CYCLOBENZAPRINE HCL 5 MG PO TABS: 5.0000 mg | ORAL_TABLET | Freq: Three times a day (TID) | ORAL | 0 refills | Status: DC | PRN

## 2020-05-08 MED ORDER — PREDNISONE 10 MG (21) PO TBPK: ORAL_TABLET | ORAL | 0 refills | Status: DC

## 2020-05-08 MED ORDER — KETOROLAC TROMETHAMINE 30 MG/ML IJ SOLN
30.0000 mg | Freq: Once | INTRAMUSCULAR | Status: AC
  Administered 2020-05-08: 30 mg via INTRAMUSCULAR

## 2020-05-08 NOTE — ED Triage Notes (Signed)
Pt presents with right side chest and breat pain for past 2 days, no injury

## 2020-05-08 NOTE — Discharge Instructions (Addendum)
Toradol given here for pain.  I am sending some muscle relaxers to the pharmacy and some prednisone to take daily for the next 6 days.  Take this with food.  I believe that this is a muscle spasm in your back with some nerve impingement. I have included your packet some exercises or stretches to do to help You can try alternating heat and ice to the area Follow up as needed for continued or worsening symptoms

## 2020-05-11 ENCOUNTER — Encounter (HOSPITAL_COMMUNITY): Payer: Self-pay

## 2020-05-11 ENCOUNTER — Emergency Department (HOSPITAL_COMMUNITY)
Admission: EM | Admit: 2020-05-11 | Discharge: 2020-05-11 | Disposition: A | Discharge status: Home / Self Care | Attending: Emergency Medicine | Admitting: Emergency Medicine

## 2020-05-11 ENCOUNTER — Other Ambulatory Visit: Payer: Self-pay

## 2020-05-11 ENCOUNTER — Emergency Department (HOSPITAL_COMMUNITY)

## 2020-05-11 DIAGNOSIS — Z720 Tobacco use: Secondary | ICD-10-CM | POA: Insufficient documentation

## 2020-05-11 DIAGNOSIS — Z90710 Acquired absence of both cervix and uterus: Secondary | ICD-10-CM | POA: Insufficient documentation

## 2020-05-11 DIAGNOSIS — R079 Chest pain, unspecified: Secondary | ICD-10-CM | POA: Insufficient documentation

## 2020-05-11 DIAGNOSIS — F4321 Adjustment disorder with depressed mood: Secondary | ICD-10-CM | POA: Insufficient documentation

## 2020-05-11 DIAGNOSIS — A5909 Other urogenital trichomoniasis: Secondary | ICD-10-CM | POA: Insufficient documentation

## 2020-05-11 DIAGNOSIS — M25511 Pain in right shoulder: Secondary | ICD-10-CM | POA: Insufficient documentation

## 2020-05-11 DIAGNOSIS — Z6824 Body mass index (BMI) 24.0-24.9, adult: Secondary | ICD-10-CM | POA: Insufficient documentation

## 2020-05-11 DIAGNOSIS — M25562 Pain in left knee: Secondary | ICD-10-CM | POA: Insufficient documentation

## 2020-05-11 DIAGNOSIS — F17210 Nicotine dependence, cigarettes, uncomplicated: Secondary | ICD-10-CM | POA: Diagnosis not present

## 2020-05-11 LAB — COMPREHENSIVE METABOLIC PANEL
ALT: 15 U/L (ref 0–44)
AST: 17 U/L (ref 15–41)
Albumin: 4 g/dL (ref 3.5–5.0)
Alkaline Phosphatase: 61 U/L (ref 38–126)
Anion gap: 8 (ref 5–15)
BUN: 16 mg/dL (ref 6–20)
CO2: 26 mmol/L (ref 22–32)
Calcium: 9.4 mg/dL (ref 8.9–10.3)
Chloride: 104 mmol/L (ref 98–111)
Creatinine, Ser: 0.87 mg/dL (ref 0.44–1.00)
GFR, Estimated: >60 mL/min (ref >60)
Glucose, Bld: 109 mg/dL — ABNORMAL HIGH (ref 70–99)
Potassium: 3.9 mmol/L (ref 3.5–5.1)
Sodium: 138 mmol/L (ref 135–145)
Total Bilirubin: 0.5 mg/dL (ref 0.3–1.2)
Total Protein: 7.2 g/dL (ref 6.5–8.1)

## 2020-05-11 LAB — CBC WITH DIFFERENTIAL/PLATELET
Abs Immature Granulocytes: 0.03 10*3/uL (ref 0.00–0.07)
Basophils Absolute: 0 10*3/uL (ref 0.0–0.1)
Basophils Relative: 0 %
Eosinophils Absolute: 0 10*3/uL (ref 0.0–0.5)
Eosinophils Relative: 0 %
HCT: 35.1 % — ABNORMAL LOW (ref 36.0–46.0)
Hemoglobin: 11.5 g/dL — ABNORMAL LOW (ref 12.0–15.0)
Immature Granulocytes: 0 %
Lymphocytes Relative: 33 %
Lymphs Abs: 2.9 10*3/uL (ref 0.7–4.0)
MCH: 31.3 pg (ref 26.0–34.0)
MCHC: 32.8 g/dL (ref 30.0–36.0)
MCV: 95.6 fL (ref 80.0–100.0)
Monocytes Absolute: 0.6 10*3/uL (ref 0.1–1.0)
Monocytes Relative: 7 %
Neutro Abs: 5.4 10*3/uL (ref 1.7–7.7)
Neutrophils Relative %: 60 %
Platelets: 259 10*3/uL (ref 150–400)
RBC: 3.67 MIL/uL — ABNORMAL LOW (ref 3.87–5.11)
RDW: 13.4 % (ref 11.5–15.5)
WBC: 9 10*3/uL (ref 4.0–10.5)
nRBC: 0 % (ref 0.0–0.2)

## 2020-05-11 LAB — D-DIMER, QUANTITATIVE: D-Dimer, Quant: <0.27 ug/mL-FEU (ref 0.00–0.50)

## 2020-05-11 LAB — TROPONIN I (HIGH SENSITIVITY)
Troponin I (High Sensitivity): 2 ng/L (ref <18)
Troponin I (High Sensitivity): 2 ng/L (ref <18)

## 2020-05-11 MED ORDER — TRAMADOL HCL 50 MG PO TABS
50.0000 mg | ORAL_TABLET | Freq: Four times a day (QID) | ORAL | 0 refills | Status: DC | PRN
Start: 2020-05-11 — End: 2021-09-23

## 2020-05-11 MED ORDER — KETOROLAC TROMETHAMINE 30 MG/ML IJ SOLN
30.0000 mg | Freq: Once | INTRAMUSCULAR | Status: AC
  Administered 2020-05-11: 30 mg via INTRAVENOUS
  Filled 2020-05-11: qty 1

## 2020-05-11 NOTE — ED Provider Notes (Signed)
[facility] Provider Note   CSN: [id number] Arrival date & time: 05/11/20  7829     History Chief Complaint  Patient presents with  . Chest Pain    Kristen Lang is a 60 y.o. female.  Patient is a 60 year old female with history of hysterectomy.  Patient presents today for evaluation of pain to the right shoulder and chest.  This has been ongoing for the past several days.  Patient was seen in urgent care 3 days ago and was prescribed prednisone, however this is not helping.  Patient describes pain to the posterior aspect of the right shoulder near the shoulder blade that is worse when she moves or palpates the area.  She denies shortness of breath, nausea, or diaphoresis.  She tells me the pain radiates down into her right arm.  The history is provided by the patient.  Chest Pain Pain location:  R chest Pain quality: sharp   Pain radiates to:  R shoulder and R arm Pain severity:  Severe Onset quality:  Sudden Duration:  3 days Timing:  Constant Progression:  Worsening Chronicity:  New Relieved by:  Nothing Worsened by:  Movement and certain positions (palpation) Ineffective treatments: Prednisone.      Past Medical History:  Diagnosis Date  . Allergy     Patient Active Problem List   Diagnosis Date Noted  . 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 Body mass index (BMI) 24.0-24.9, adult 05/11/2020  . Grief 05/11/2020  . Left knee pain 05/11/2020  . Tobacco use 05/11/2020  . Trichomonal cervicitis 05/11/2020  . Colon cancer screening 01/25/2019  . Pelvic mass in female 03/13/2013    Past Surgical History:  Procedure Laterality Date  . ABDOMINAL HYSTERECTOMY    . COLONOSCOPY WITH PROPOFOL N/A 04/16/2019   Procedure: COLONOSCOPY WITH PROPOFOL;  Surgeon: West Bali, MD;  Location: [facility];  Service: Endoscopy;  Laterality: N/A;  10:30am     OB History    Gravida  2   Para  2   Term      Preterm      AB      Living   2     SAB      IAB      Ectopic      Multiple      Live Births  2           Family History  Problem Relation Age of Onset  . Diabetes Mother   . Hypertension Mother   . Cancer Father   . Hypertension Sister   . Heart disease Sister   . Hypertension Brother   . Diabetes Brother   . Cancer Maternal Grandfather   . Colon cancer Paternal Grandfather        after age 14  . Hypertension Sister   . Hypertension Sister   . Hypertension Sister   . Hypertension Brother   . Heart disease Brother   . Hypertension Brother   . Hypertension Brother   . Drug abuse Brother     Social History   Tobacco Use  . Smoking status: Current Every Day Smoker    Packs/day: 0.25    Years: 9.00    Pack years: 2.25    Types: Cigarettes  . Smokeless tobacco: Never Used  Vaping Use  . Vaping Use: Never used  Substance Use Topics  . Alcohol use: Yes    Comment: 2-3 12 oz beer a day  . Drug use: No  Home Medications Prior to Admission medications   Medication Sig Start Date End Date Taking? Authorizing Provider  Ascorbic Acid (VITAMIN C) 1000 MG tablet Take 1,000 mg by mouth once a week.    [provider]  Cholecalciferol (VITAMIN D3) 125 MCG (5000 UT) CAPS Take 5,000 Units by mouth once a week.     [provider]  [facility] Liver Oil CAPS Take 1 capsule by mouth daily.     [provider]  cyclobenzaprine (FLEXERIL) 5 MG tablet Take 1 tablet (5 mg total) by mouth 3 (three) times daily as needed for muscle spasms. 05/08/20   Loura Halt A, NP  predniSONE (STERAPRED UNI-PAK 21 TAB) 10 MG (21) TBPK tablet 6 tabs for 1 day, then 5 tabs for 1 das, then 4 tabs for 1 day, then 3 tabs for 1 day, 2 tabs for 1 day, then 1 tab for 1 day 05/08/20   Loura Halt A, NP  Vitamin A 2400 MCG (8000 UT) CAPS Take 8,000 Units by mouth once a week.     [provider]    Allergies    Dilaudid [hydromorphone hcl] and Hydrocodone  Review of Systems   Review of Systems   Cardiovascular: Positive for chest pain.  All other systems reviewed and are negative.   Physical Exam Updated Vital Signs Temp 98.4 F (36.9 C)   Ht 5\' 7"  (1.702 m)   Wt 69.4 kg   BMI 23.96 kg/m   Physical Exam Vitals and nursing note reviewed.  Constitutional:      General: She is not in acute distress.    Appearance: She is well-developed and well-nourished. She is not diaphoretic.  HENT:     Head: Normocephalic and atraumatic.  Cardiovascular:     Rate and Rhythm: Normal rate and regular rhythm.     Heart sounds: No murmur heard. No friction rub. No gallop.   Pulmonary:     Effort: Pulmonary effort is normal. No respiratory distress.     Breath sounds: Normal breath sounds. No wheezing.  Abdominal:     General: Bowel sounds are normal. There is no distension.     Palpations: Abdomen is soft.     Tenderness: There is no abdominal tenderness.  Musculoskeletal:        General: Normal range of motion.     Cervical back: Normal range of motion and neck supple.     Comments: There is tenderness to palpation over the posterior right shoulder just medial to the scapula.  There is an area of point tenderness here.  Ulnar and radial pulses are easily palpable and motor and sensation are intact throughout the entire hand.  Skin:    General: Skin is warm and dry.  Neurological:     Mental Status: She is alert and oriented to person, place, and time.     ED Results / Procedures / Treatments   Labs (all labs ordered are listed, but only abnormal results are displayed) Labs Reviewed  COMPREHENSIVE METABOLIC PANEL  CBC WITH DIFFERENTIAL/PLATELET  D-DIMER, QUANTITATIVE (NOT AT [facility])  TROPONIN I (HIGH SENSITIVITY)    EKG ED ECG REPORT   Date: 05/11/2020  Rate: 68  Rhythm: normal sinus rhythm  QRS Axis: normal  Intervals: normal  ST/T Wave abnormalities: normal  Conduction Disutrbances:none  Narrative Interpretation:   Old EKG Reviewed: none available  I have  personally reviewed the EKG tracing and agree with the computerized printout as noted.      Radiology No  results found.  Procedures Procedures (including critical care time)  Medications Ordered in ED Medications  ketorolac (TORADOL) 30 MG/ML injection 30 mg (has no administration in time range)    ED Course  I have reviewed the triage vital signs and the nursing notes.  Pertinent labs & imaging results that were available during my care of the patient were reviewed by me and considered in my medical decision making (see chart for details).    MDM Rules/Calculators/A&P  Patient is a 60 year old female with no prior cardiac history presenting with complaints of right shoulder pain. This pain appears very musculoskeletal in nature. She has point tenderness just medial to the scapula on the right side. This pain radiates into her arm. She is neurovascularly intact in the right arm.  Patient arrives here with stable vital signs and unremarkable physical examination except for tenderness as described above. Her work-up shows negative troponin, unchanged EKG, negative D-dimer, and clear chest x-ray.  I highly suspect a musculoskeletal etiology. Patient has been treated with prednisone and Flexeril. I will give her a small quantity of pain medicine which she can take as she has had minimal relief with Flexeril.  Final Clinical Impression(s) / ED Diagnoses Final diagnoses:  None    Rx / DC Orders ED Discharge Orders    None       Veryl Speak, MD 05/11/20 [phone]

## 2020-05-11 NOTE — ED Triage Notes (Signed)
Pt c/o right sided chest pain that radiates to her right arm and through to her back. Pt seen at [facility] for same, states pain is worse

## 2020-05-11 NOTE — Discharge Instructions (Addendum)
Continue prednisone as previously prescribed.  Begin taking tramadol as prescribed as needed for pain.  Follow-up with your primary doctor if symptoms or not improving in the next week. Return to the ER symptoms significantly worsen or change in the meantime.

## 2020-05-15 ENCOUNTER — Other Ambulatory Visit (HOSPITAL_COMMUNITY): Payer: Self-pay | Admitting: Family Medicine

## 2020-05-15 DIAGNOSIS — Z1231 Encounter for screening mammogram for malignant neoplasm of breast: Secondary | ICD-10-CM

## 2020-05-18 ENCOUNTER — Other Ambulatory Visit: Payer: Self-pay | Admitting: Family Medicine

## 2020-05-18 ENCOUNTER — Other Ambulatory Visit (HOSPITAL_COMMUNITY): Payer: Self-pay | Admitting: Family Medicine

## 2020-05-18 DIAGNOSIS — M79601 Pain in right arm: Secondary | ICD-10-CM

## 2020-05-19 ENCOUNTER — Ambulatory Visit (HOSPITAL_COMMUNITY)
Admission: RE | Admit: 2020-05-19 | Discharge: 2020-05-19 | Disposition: A | Discharge status: Home / Self Care | Source: Ambulatory Visit | Attending: Family Medicine | Admitting: Family Medicine

## 2020-05-19 ENCOUNTER — Other Ambulatory Visit: Payer: Self-pay

## 2020-05-19 DIAGNOSIS — M79601 Pain in right arm: Secondary | ICD-10-CM | POA: Diagnosis not present

## 2020-05-22 ENCOUNTER — Other Ambulatory Visit: Payer: Self-pay | Admitting: Family Medicine

## 2020-05-26 ENCOUNTER — Other Ambulatory Visit: Payer: Self-pay | Admitting: Family Medicine

## 2020-05-26 ENCOUNTER — Other Ambulatory Visit (HOSPITAL_COMMUNITY): Payer: Self-pay | Admitting: Family Medicine

## 2020-05-26 DIAGNOSIS — M79601 Pain in right arm: Secondary | ICD-10-CM

## 2020-05-29 ENCOUNTER — Other Ambulatory Visit (HOSPITAL_COMMUNITY): Payer: Self-pay | Admitting: Family Medicine

## 2020-05-29 DIAGNOSIS — M79601 Pain in right arm: Secondary | ICD-10-CM

## 2020-06-03 ENCOUNTER — Ambulatory Visit (HOSPITAL_COMMUNITY)

## 2020-06-03 ENCOUNTER — Encounter (HOSPITAL_COMMUNITY): Payer: Self-pay

## 2020-06-11 ENCOUNTER — Ambulatory Visit (HOSPITAL_COMMUNITY)
Admission: RE | Admit: 2020-06-11 | Discharge: 2020-06-11 | Disposition: A | Discharge status: Home / Self Care | Source: Ambulatory Visit | Attending: Family Medicine | Admitting: Family Medicine

## 2020-06-11 ENCOUNTER — Other Ambulatory Visit: Payer: Self-pay

## 2020-06-11 DIAGNOSIS — M79601 Pain in right arm: Secondary | ICD-10-CM | POA: Insufficient documentation

## 2020-06-24 ENCOUNTER — Other Ambulatory Visit: Payer: Self-pay

## 2020-06-24 ENCOUNTER — Ambulatory Visit (HOSPITAL_COMMUNITY)
Admission: RE | Admit: 2020-06-24 | Discharge: 2020-06-24 | Disposition: A | Discharge status: Home / Self Care | Source: Ambulatory Visit | Attending: Family Medicine | Admitting: Family Medicine

## 2020-06-24 DIAGNOSIS — Z1231 Encounter for screening mammogram for malignant neoplasm of breast: Secondary | ICD-10-CM | POA: Diagnosis not present

## 2021-08-05 ENCOUNTER — Other Ambulatory Visit (HOSPITAL_COMMUNITY): Payer: Self-pay | Admitting: Family Medicine

## 2021-08-05 DIAGNOSIS — D1723 Benign lipomatous neoplasm of skin and subcutaneous tissue of right leg: Secondary | ICD-10-CM

## 2021-08-05 DIAGNOSIS — M5416 Radiculopathy, lumbar region: Secondary | ICD-10-CM | POA: Insufficient documentation

## 2021-08-09 ENCOUNTER — Ambulatory Visit (HOSPITAL_COMMUNITY)
Admission: RE | Admit: 2021-08-09 | Discharge: 2021-08-09 | Disposition: A | Discharge status: Home / Self Care | Source: Ambulatory Visit | Attending: Family Medicine | Admitting: Family Medicine

## 2021-08-09 DIAGNOSIS — D1723 Benign lipomatous neoplasm of skin and subcutaneous tissue of right leg: Secondary | ICD-10-CM | POA: Diagnosis present

## 2021-08-17 ENCOUNTER — Other Ambulatory Visit (HOSPITAL_COMMUNITY): Payer: Self-pay

## 2021-08-17 ENCOUNTER — Emergency Department (HOSPITAL_COMMUNITY)
Admission: EM | Admit: 2021-08-17 | Discharge: 2021-08-17 | Disposition: A | Discharge status: Home / Self Care | Attending: Emergency Medicine | Admitting: Emergency Medicine

## 2021-08-17 ENCOUNTER — Emergency Department (HOSPITAL_COMMUNITY)

## 2021-08-17 ENCOUNTER — Other Ambulatory Visit: Payer: Self-pay

## 2021-08-17 ENCOUNTER — Encounter (HOSPITAL_COMMUNITY): Payer: Self-pay

## 2021-08-17 DIAGNOSIS — M79604 Pain in right leg: Secondary | ICD-10-CM | POA: Diagnosis present

## 2021-08-17 DIAGNOSIS — Z7901 Long term (current) use of anticoagulants: Secondary | ICD-10-CM | POA: Diagnosis not present

## 2021-08-17 DIAGNOSIS — M545 Low back pain, unspecified: Secondary | ICD-10-CM | POA: Diagnosis not present

## 2021-08-17 DIAGNOSIS — I82461 Acute embolism and thrombosis of right calf muscular vein: Secondary | ICD-10-CM | POA: Insufficient documentation

## 2021-08-17 LAB — CBC WITH DIFFERENTIAL/PLATELET
Abs Immature Granulocytes: 0.02 10*3/uL (ref 0.00–0.07)
Basophils Absolute: 0 10*3/uL (ref 0.0–0.1)
Basophils Relative: 1 %
Eosinophils Absolute: 0.1 10*3/uL (ref 0.0–0.5)
Eosinophils Relative: 1 %
HCT: 34.7 % — ABNORMAL LOW (ref 36.0–46.0)
Hemoglobin: 11.4 g/dL — ABNORMAL LOW (ref 12.0–15.0)
Immature Granulocytes: 0 %
Lymphocytes Relative: 45 %
Lymphs Abs: 3 10*3/uL (ref 0.7–4.0)
MCH: 31.2 pg (ref 26.0–34.0)
MCHC: 32.9 g/dL (ref 30.0–36.0)
MCV: 95.1 fL (ref 80.0–100.0)
Monocytes Absolute: 0.3 10*3/uL (ref 0.1–1.0)
Monocytes Relative: 5 %
Neutro Abs: 3.2 10*3/uL (ref 1.7–7.7)
Neutrophils Relative %: 48 %
Platelets: 216 10*3/uL (ref 150–400)
RBC: 3.65 MIL/uL — ABNORMAL LOW (ref 3.87–5.11)
RDW: 13.2 % (ref 11.5–15.5)
WBC: 6.6 10*3/uL (ref 4.0–10.5)
nRBC: 0 % (ref 0.0–0.2)

## 2021-08-17 LAB — BASIC METABOLIC PANEL
Anion gap: 5 (ref 5–15)
BUN: 16 mg/dL (ref 6–20)
CO2: 26 mmol/L (ref 22–32)
Calcium: 8.9 mg/dL (ref 8.9–10.3)
Chloride: 107 mmol/L (ref 98–111)
Creatinine, Ser: 0.75 mg/dL (ref 0.44–1.00)
GFR, Estimated: >60 mL/min (ref >60)
Glucose, Bld: 105 mg/dL — ABNORMAL HIGH (ref 70–99)
Potassium: 3.9 mmol/L (ref 3.5–5.1)
Sodium: 138 mmol/L (ref 135–145)

## 2021-08-17 MED ORDER — APIXABAN 5 MG PO TABS
10.0000 mg | ORAL_TABLET | Freq: Once | ORAL | Status: AC
  Administered 2021-08-17: 10 mg via ORAL
  Filled 2021-08-17: qty 2

## 2021-08-17 MED ORDER — APIXABAN 5 MG PO TABS: ORAL_TABLET | ORAL | 0 refills | Status: DC

## 2021-08-17 MED ORDER — FENTANYL CITRATE PF 50 MCG/ML IJ SOSY
50.0000 ug | PREFILLED_SYRINGE | Freq: Once | INTRAMUSCULAR | Status: AC
  Administered 2021-08-17: 50 ug via INTRAMUSCULAR
  Filled 2021-08-17: qty 1

## 2021-08-17 MED ORDER — DIAZEPAM 2 MG PO TABS
2.0000 mg | ORAL_TABLET | Freq: Once | ORAL | Status: AC
Start: 2021-08-17 — End: 2021-08-17
  Administered 2021-08-17: 2 mg via ORAL
  Filled 2021-08-17: qty 1

## 2021-08-17 MED ORDER — DIAZEPAM 2 MG PO TABS: 2.0000 mg | ORAL_TABLET | Freq: Two times a day (BID) | ORAL | 0 refills | Status: AC

## 2021-08-17 NOTE — Discharge Instructions (Addendum)
Right leg pain-this is likely secondary due to sciatica, I like you to follow-up with neurosurgery for further evaluation.  Given you the Valium please take as prescribed.  You may also supplement this with ibuprofen and Tylenol.  Also given some exercises to perform at home please perform this will help with your right leg pain.  I would like you to discontinue the meloxicam as this reacts poorly with the blood thinner that you are taking. ?DVT-I have started you on a blood thinner for the blood clot in your right leg, please remember that this will increase your risk of bleeding, if you have any sort of head injury or involved in a trauma you must come back in for reevaluation.  Please follow-up with hematology for further evaluation. ? ?Come back to the emergency department if you develop chest pain, shortness of breath, severe abdominal pain, uncontrolled nausea, vomiting, diarrhea. ? ?

## 2021-08-17 NOTE — ED Provider Notes (Signed)
?[facility] ?Provider Note ? ? ?CSN: [id number] ?Arrival date & time: 08/17/21  1005 ? ?  ? ?History ? ?Chief Complaint  ?Patient presents with  ? Leg Pain  ? ? ?Kristen Lang is a 61 y.o. female. ? ?HPI ? ? Patient with medical history including allergies presents with complaints of right lower back pain.  States the pain started about 3 days ago, came on suddenly, pain is mainly in her right buttocks and then radiate down to her right leg.  States she will have occasional paresthesias which is mainly on the lateral aspect of her leg, pain is worse with movement improved with rest, she denies any saddle paresthesias urinary or bowel incontinency, denies any recent trauma to the area, no associated fevers chills chest pain peripheral edema denies any chest pain or shortness of breath.  States that she has had pain like this in the past so that she had sciatica.  She states that she has been taking muscle laxer and steroids not much relief. ? ?I reviewed patient's charts she had a CT scan performed at 5 years ago showing arthritic changes around L4-L5, patient not had any consultation with neurosurgery. ? ?Home Medications ?Prior to Admission medications   ?Medication Sig Start Date End Date Taking? Authorizing Provider  ?apixaban (ELIQUIS) 5 MG TABS tablet Take 2 tablets ('10mg'$ ) twice daily for 7 days, then 1 tablet ('5mg'$ ) twice daily 08/17/21  Yes Marcello Fennel, PA-C  ?Cod Liver Oil CAPS Take 1 capsule by mouth daily.    Yes [provider]  ?diazepam (VALIUM) 2 MG tablet Take 1 tablet (2 mg total) by mouth 2 (two) times daily for 7 days. 08/17/21 08/24/21 Yes Marcello Fennel, PA-C  ?meloxicam (MOBIC) 15 MG tablet Take 15 mg by mouth daily. 08/05/21  Yes [provider]  ?cyclobenzaprine (FLEXERIL) 5 MG tablet Take 1 tablet (5 mg total) by mouth 3 (three) times daily as needed for muscle spasms. ?Patient not taking: Reported on 08/17/2021 05/08/20   Loura Halt A, NP   ?predniSONE (STERAPRED UNI-PAK 21 TAB) 10 MG (21) TBPK tablet 6 tabs for 1 day, then 5 tabs for 1 das, then 4 tabs for 1 day, then 3 tabs for 1 day, 2 tabs for 1 day, then 1 tab for 1 day ?Patient not taking: Reported on 08/17/2021 05/08/20   Loura Halt A, NP  ?traMADol (ULTRAM) 50 MG tablet Take 1 tablet (50 mg total) by mouth every 6 (six) hours as needed. ?Patient not taking: Reported on 08/17/2021 05/11/20   Veryl Speak, MD  ?   ? ?Allergies    ?Dilaudid [hydromorphone hcl] and Hydrocodone   ? ?Review of Systems   ?Review of Systems  ?Constitutional:  Negative for chills and fever.  ?Respiratory:  Negative for shortness of breath.   ?Cardiovascular:  Negative for chest pain.  ?Gastrointestinal:  Negative for abdominal pain.  ?Musculoskeletal:  Positive for back pain.  ?Neurological:  Negative for headaches.  ? ?Physical Exam ?Updated Vital Signs ?BP 135/75   Pulse 64   Temp 98.6 ?F (37 ?C) (Oral)   Resp 16   Ht '5\' 7"'$  (1.702 m)   Wt 68 kg   SpO2 100%   BMI 23.49 kg/m?  ?Physical Exam ?Vitals and nursing note reviewed.  ?Constitutional:   ?   General: She is not in acute distress. ?   Appearance: She is not ill-appearing.  ?HENT:  ?   Head: Normocephalic and atraumatic.  ?  Nose: No congestion.  ?Eyes:  ?   Conjunctiva/sclera: Conjunctivae normal.  ?Cardiovascular:  ?   Rate and Rhythm: Normal rate and regular rhythm.  ?   Pulses: Normal pulses.  ?   Heart sounds: No murmur heard. ?  No friction rub. No gallop.  ?Pulmonary:  ?   Effort: Pulmonary effort is normal.  ?Musculoskeletal:  ?   Comments: Spine was visualized no overlying skin changes, spine was palpated was nontender to palpation no step-off deformities noted.  She is notably tender within the right buttocks, pain will radiate down her right leg along the lateral aspect.  She had 5 out of 5 strength neurovascular intact in the lower extremities 2+ dorsal pedal pulses.    ?Skin: ?   General: Skin is warm and dry.  ?Neurological:  ?   Mental  Status: She is alert.  ?Psychiatric:     ?   Mood and Affect: Mood normal.  ? ? ?ED Results / Procedures / Treatments   ?Labs ?(all labs ordered are listed, but only abnormal results are displayed) ?Labs Reviewed  ?BASIC METABOLIC PANEL - Abnormal; Notable for the following components:  ?    Result Value  ? Glucose, Bld 105 (*)   ? All other components within normal limits  ?CBC WITH DIFFERENTIAL/PLATELET - Abnormal; Notable for the following components:  ? RBC 3.65 (*)   ? Hemoglobin 11.4 (*)   ? HCT 34.7 (*)   ? All other components within normal limits  ? ? ?EKG ?None ? ?Radiology ?US Venous Img Lower Unilateral Right ? ?Result Date: 08/17/2021 ?CLINICAL DATA:  Right lower extremity calf pain for 5 days EXAM: RIGHT LOWER EXTREMITY VENOUS DOPPLER ULTRASOUND TECHNIQUE: Gray-scale sonography with graded compression, as well as color Doppler and duplex ultrasound were performed to evaluate the lower extremity deep venous systems from the level of the common femoral vein and including the common femoral, femoral, profunda femoral, popliteal and calf veins including the posterior tibial, peroneal and gastrocnemius veins when visible. The superficial great saphenous vein was also interrogated. Spectral Doppler was utilized to evaluate flow at rest and with distal augmentation maneuvers in the common femoral, femoral and popliteal veins. COMPARISON:  None. FINDINGS: Contralateral Common Femoral Vein: Respiratory phasicity is normal and symmetric with the symptomatic side. No evidence of thrombus. Normal compressibility. Common Femoral Vein: No evidence of thrombus. Normal compressibility, respiratory phasicity and response to augmentation. Saphenofemoral Junction: No evidence of thrombus. Normal compressibility and flow on color Doppler imaging. Profunda Femoral Vein: No evidence of thrombus. Normal compressibility and flow on color Doppler imaging. Femoral Vein: No evidence of thrombus. Normal compressibility,  respiratory phasicity and response to augmentation. Popliteal Vein: No evidence of thrombus. Normal compressibility, respiratory phasicity and response to augmentation. Calf Veins: Right posterior tibial and peroneal veins appear patent incompressible. The anterior tibial vein is noncompressible with hypoechoic intraluminal thrombus appearing occlusive. No phasic flow demonstrated. Very low thrombus burden. No propagation into the popliteal vein. IMPRESSION: Calf anterior tibial peripheral right lower extremity DVT. Very low thrombus burden. No propagation into the popliteal or femoral veins. Electronically Signed   By: Jerilynn Mages.  Shick M.D.   On: 08/17/2021 13:55   ? ?Procedures ?Procedures  ? ? ?Medications Ordered in ED ?Medications  ?diazepam (VALIUM) tablet 2 mg (2 mg Oral Given 08/17/21 1153)  ?fentaNYL (SUBLIMAZE) injection 50 mcg (50 mcg Intramuscular Given 08/17/21 1425)  ?apixaban (ELIQUIS) tablet 10 mg (10 mg Oral Given 08/17/21 1516)  ? ? ?ED Course/ Medical  Decision Making/ A&P ?  ?                        ?Medical Decision Making ?Amount and/or Complexity of Data Reviewed ?Labs: ordered. ? ?Risk ?Prescription drug management. ? ? ?This patient presents to the ED for concern of right sided back pain, this involves an extensive number of treatment options, and is a complaint that carries with it a high risk of complications and morbidity.  The differential diagnosis includes dissection, spine equina, fracture, dislocation ? ? ? ?Additional history obtained: ? ?Additional history obtained from electronic medical record ?External records from outside source obtained and reviewed including please review HPI ? ? ?Co morbidities that complicate the patient evaluation ? ?Benign ? ?Social Determinants of Health: ? ?N/A ? ? ? ?Lab Tests: ? ?I Ordered, and personally interpreted labs.  The pertinent results include: None I ? ? ?Imaging Studies ordered: ? ?I ordered imaging studies including N/A ?I independently visualized  and interpreted imaging which showed N/A ?I agree with the radiologist interpretation ? ? ?Cardiac Monitoring: ? ?The patient was maintained on a cardiac monitor.  I personally viewed and interpreted the cardiac monitored which

## 2021-08-17 NOTE — ED Triage Notes (Signed)
Patient presents with a complaint of right lower back pain radiating down right leg into toes, states it is a burning pain with numbness. PCP RX prednisone and meloxicam without relief  ?

## 2021-08-17 NOTE — TOC Benefit Eligibility Note (Signed)
Patient Advocate Encounter ? ?Insurance verification completed.   ? ?The patient is currently admitted and upon discharge could be taking Eliquis 5 mg. ? ?The current 30 day co-pay is, $24.00.  ? ?The patient is insured through [employer] ([place] is the only [employer] that takes T[employer])  ? ? ? ?Lyndel Safe, CPhT ?Pharmacy Patient Advocate Specialist ?[facility] [place] [facility] Patient Advocate Team ?Direct Number: [phone]  Fax: [phone] ? ? ? ? ? ?  ?

## 2021-09-22 NOTE — Progress Notes (Signed)
[address] [place], [place] [address]   CLINIC:  Medical Oncology/Hematology  CONSULT NOTE  Patient Care Team: [facility] as PCP - General  CHIEF COMPLAINTS/PURPOSE OF CONSULTATION:  Right lower extremity DVT  HISTORY OF PRESENTING ILLNESS:  Kristen Lang 61 y.o. female is here per referral from ED provider for evaluation of right lower extremity DVT.  Patient presented to the ED on 08/17/2021 with complaints of right leg pain.  She was found to have small occlusive DVT of anterior tibial vein.  She was discharged with prescription for Eliquis.  She is tolerating Eliquis well without any major bleeding events.  She is still having some right leg pain and very mild edema and tenderness, but this is improved from before.  She denies any chest pain, shortness of breath, or hemoptysis.  She reports 100% energy and 100% appetite.  She denies any obvious provoking factors such as recent surgery, travel, prolonged immobility, hospitalization, hormone supplements, injury, or malignancy.  She is very active at baseline, but she does report that she falls asleep in a seated position with her legs hanging down several times per week, sometimes will remain in that position for 4 to 6 hours.  Her last COVID booster was with the [place] vaccine in December 2021.  She has not had any previous DVT or PE.  She denies any personal history of miscarriages.  She was not on any blood thinners or aspirin at the time of the DVT.  She denies any known history of autoimmune, connective tissue, or rheumatoid disease.  She denies any personal history of malignancy.  She has no fever, chills, night sweats, unintentional weight loss.  She is up-to-date on her age-appropriate screenings - colonoscopy in December 2020 was essentially normal.  Mammogram in February 2022 was negative for any evidence of malignancy.    She denies any family history of DVT, PE, or  miscarriages.  She does report family history of malignancy - father had unspecified cancer, paternal uncle had bladder cancer, paternal grandfather had colon cancer.  Past medical history is significant only for seasonal allergies.  She works as the Geophysical data processor.  She smokes 0.5 PPD since 2006 (8.5-pack-year history).  She drinks 1-2 beers daily.  She denies any illicit drug use.    MEDICAL HISTORY:  Past Medical History:  Diagnosis Date   Allergy     SURGICAL HISTORY: Past Surgical History:  Procedure Laterality Date   ABDOMINAL HYSTERECTOMY     COLONOSCOPY WITH PROPOFOL N/A 04/16/2019   Procedure: COLONOSCOPY WITH PROPOFOL;  Surgeon: Danie Binder, MD;  Location: [facility];  Service: Endoscopy;  Laterality: N/A;  10:30am    SOCIAL HISTORY: Social History   Socioeconomic History   Marital status: Legally Separated    Spouse name: Not on file   Number of children: Not on file   Years of education: Not on file   Highest education level: Not on file  Occupational History   Not on file  Tobacco Use   Smoking status: Every Day    Packs/day: 0.25    Years: 9.00    Pack years: 2.25    Types: Cigarettes   Smokeless tobacco: Never  Vaping Use   Vaping Use: Never used  Substance and Sexual Activity   Alcohol use: Yes    Comment: 2-3 12 oz beer a day   Drug use: No   Sexual activity: Yes  Birth control/protection: Surgical  Other Topics Concern   Not on file  Social History Narrative   Not on file   Social Determinants of Health   Financial Resource Strain: Not on file  Food Insecurity: Not on file  Transportation Needs: Not on file  Physical Activity: Not on file  Stress: Not on file  Social Connections: Not on file  Intimate Partner Violence: Not on file    FAMILY HISTORY: Family History  Problem Relation Age of Onset   Diabetes Mother    Hypertension Mother    Cancer Father    Hypertension Sister    Heart disease Sister     Hypertension Brother    Diabetes Brother    Cancer Maternal Grandfather    Colon cancer Paternal Grandfather        after age 29   Hypertension Sister    Hypertension Sister    Hypertension Sister    Hypertension Brother    Heart disease Brother    Hypertension Brother    Hypertension Brother    Drug abuse Brother     ALLERGIES:  is allergic to dilaudid [hydromorphone hcl] and hydrocodone.  MEDICATIONS:  Current Outpatient Medications  Medication Sig Dispense Refill   apixaban (ELIQUIS) 5 MG TABS tablet Take 2 tablets ('10mg'$ ) twice daily for 7 days, then 1 tablet ('5mg'$ ) twice daily 60 tablet 0   Cod Liver Oil CAPS Take 1 capsule by mouth daily.      cyclobenzaprine (FLEXERIL) 5 MG tablet Take 1 tablet (5 mg total) by mouth 3 (three) times daily as needed for muscle spasms. (Patient not taking: Reported on 08/17/2021) 30 tablet 0   meloxicam (MOBIC) 15 MG tablet Take 15 mg by mouth daily.     predniSONE (STERAPRED UNI-PAK 21 TAB) 10 MG (21) TBPK tablet 6 tabs for 1 day, then 5 tabs for 1 das, then 4 tabs for 1 day, then 3 tabs for 1 day, 2 tabs for 1 day, then 1 tab for 1 day (Patient not taking: Reported on 08/17/2021) 21 tablet 0   traMADol (ULTRAM) 50 MG tablet Take 1 tablet (50 mg total) by mouth every 6 (six) hours as needed. (Patient not taking: Reported on 08/17/2021) 12 tablet 0   No current facility-administered medications for this visit.    REVIEW OF SYSTEMS:   Review of Systems  Constitutional:  Positive for fatigue. Negative for appetite change, chills, diaphoresis, fever and unexpected weight change.  HENT:   Negative for lump/mass and nosebleeds.   Eyes:  Negative for eye problems.  Respiratory:  Negative for cough, hemoptysis and shortness of breath.   Cardiovascular:  Negative for chest pain, leg swelling and palpitations.  Gastrointestinal:  Negative for abdominal pain, blood in stool, constipation, diarrhea, nausea and vomiting.  Genitourinary:  Negative for  hematuria.   Musculoskeletal:  Positive for myalgias (right leg pain).  Skin: Negative.   Neurological:  Positive for numbness (numbness at top of right foot). Negative for dizziness, headaches and light-headedness.  Hematological:  Does not bruise/bleed easily.     PHYSICAL EXAMINATION: ECOG PERFORMANCE STATUS: 1 - Symptomatic but completely ambulatory  There were no vitals filed for this visit. There were no vitals filed for this visit.  Physical Exam Constitutional:      Appearance: Normal appearance.  HENT:     Head: Normocephalic and atraumatic.     Mouth/Throat:     Mouth: Mucous membranes are moist.  Eyes:     Extraocular Movements: Extraocular movements intact.  Pupils: Pupils are equal, round, and reactive to light.  Cardiovascular:     Rate and Rhythm: Normal rate and regular rhythm.     Pulses: Normal pulses.     Heart sounds: Normal heart sounds.  Pulmonary:     Effort: Pulmonary effort is normal.     Breath sounds: Normal breath sounds.  Abdominal:     General: Bowel sounds are normal.     Palpations: Abdomen is soft.     Tenderness: There is no abdominal tenderness.  Musculoskeletal:        General: No swelling.     Right lower leg: Edema (trace RLE edema, tender to palpation) present.     Left lower leg: No edema.  Lymphadenopathy:     Cervical: No cervical adenopathy.  Skin:    General: Skin is warm and dry.  Neurological:     General: No focal deficit present.     Mental Status: She is alert and oriented to person, place, and time.  Psychiatric:        Mood and Affect: Mood normal.        Behavior: Behavior normal.      LABORATORY DATA:  I have reviewed the data as listed Recent Results (from the past 2160 hour(s))  Basic metabolic panel     Status: Abnormal   Collection Time: 08/17/21  2:35 PM  Result Value Ref Range   Sodium 138 135 - 145 mmol/L   Potassium 3.9 3.5 - 5.1 mmol/L   Chloride 107 98 - 111 mmol/L   CO2 26 22 - 32 mmol/L    Glucose, Bld 105 (H) 70 - 99 mg/dL    Comment: Glucose reference range applies only to samples taken after fasting for at least 8 hours.   BUN 16 6 - 20 mg/dL   Creatinine, Ser 0.75 0.44 - 1.00 mg/dL   Calcium 8.9 8.9 - 10.3 mg/dL   GFR, Estimated >60 >60 mL/min    Comment: (NOTE) Calculated using the CKD-EPI Creatinine Equation (2021)    Anion gap 5 5 - 15    Comment: Performed at [facility], [address], [place], [place] [address]  CBC with Differential     Status: Abnormal   Collection Time: 08/17/21  2:35 PM  Result Value Ref Range   WBC 6.6 4.0 - 10.5 K/uL   RBC 3.65 (L) 3.87 - 5.11 MIL/uL   Hemoglobin 11.4 (L) 12.0 - 15.0 g/dL   HCT 34.7 (L) 36.0 - 46.0 %   MCV 95.1 80.0 - 100.0 fL   MCH 31.2 26.0 - 34.0 pg   MCHC 32.9 30.0 - 36.0 g/dL   RDW 13.2 11.5 - 15.5 %   Platelets 216 150 - 400 K/uL   nRBC 0.0 0.0 - 0.2 %   Neutrophils Relative % 48 %   Neutro Abs 3.2 1.7 - 7.7 K/uL   Lymphocytes Relative 45 %   Lymphs Abs 3.0 0.7 - 4.0 K/uL   Monocytes Relative 5 %   Monocytes Absolute 0.3 0.1 - 1.0 K/uL   Eosinophils Relative 1 %   Eosinophils Absolute 0.1 0.0 - 0.5 K/uL   Basophils Relative 1 %   Basophils Absolute 0.0 0.0 - 0.1 K/uL   Immature Granulocytes 0 %   Abs Immature Granulocytes 0.02 0.00 - 0.07 K/uL    Comment: Performed at [facility], [address] [place], [place] [address]    RADIOGRAPHIC STUDIES: I have personally reviewed the radiological images as listed and agreed  with the findings in the report. No results found.  ASSESSMENT & PLAN: 1.  Right lower extremity DVT, unprovoked - Referred by ED provider - ED visit on 08/17/2021 due to right leg pain.  Venous duplex ultrasound showed small occlusive DVT of anterior tibial vein of the right calf. - Patient was placed on Eliquis since 08/17/2021.  She is tolerating Eliquis well without any major bleeding events.   - No obvious provoking factors such as recent surgery, travel, prolonged immobility,  hospitalization, hormone supplements, injury, or malignancy. - Her only questionable provoking factor is that she falls asleep in a seated position with her legs hanging down several times per week, sometimes will remain in that position for 4 to 6 hours. -No prior history of DVT, PE, or miscarriages.  No personal history of autoimmune or rheumatoid disorders.  No family history of VTE or miscarriages. - No B symptoms.  She is up-to-date on age-appropriate screenings including mammogram and colonoscopy. - Labs done by her PCP (08/19/2020) were negative for PT gene mutation, cardiolipin antibodies, and beta-2 glycoprotein antibodies.  Lab panel also checked for lupus anticoagulant, protein C, protein S, and Antithrombin III, however since these were obtained while the patient was on Eliquis they are considered unreliable. - PLAN: We will recheck RLE ultrasound after 6 months of anticoagulation (October 2023).  We will also check D-dimer and factor V Leiden at that time. - If DVT resolved, we will consider discontinuation of anticoagulation at that time, followed by completion of coagulopathy panel (lupus anticoagulant, protein C, protein C S, Antithrombin III)  2.  Other history - PMH: Seasonal allergies - SOCIAL: She works as Dealer of a Copywriter, advertising.  She smokes 0.5 PPD since 2006 (8.5-pack-year history).  She drinks 1-2 beers daily.  She denies any illicit drug use. - FAMILY: She denies any family history of DVT, PE, or miscarriages.  She does report family history of malignancy - father had unspecified cancer, paternal uncle had bladder cancer, paternal grandfather had colon cancer.   All questions were answered. The patient knows to call the clinic with any problems, questions or concerns.   Medical decision making: Moderate  Time spent on visit: I spent 25 minutes counseling the patient face to face. The total time spent in the appointment was 40 minutes and more than 50% was on  counseling.  I, Tarri Abernethy PA-C, have seen this patient in conjunction with Dr. Derek Jack.  Greater than 50% of visit was performed by Dr. Delton Coombes.   Harriett Rush, PA-C 09/23/2021 10:19 AM  DR. Daylee Delahoz: I have independently evaluated this patient and formulated my assessment and plan.  I agree with HPI, assessment and plan written by Casey Burkitt, PA-C.  In brief, she had unprovoked calf anterior tibial peripheral right lower extremity DVT with very low thrombus burden.  Few of hyper coagulable work-up was negative.  We will repeat lupus anticoagulant.  Even though she has distal DVT, as it is unprovoked, recommend 6 months of anticoagulation.  At that time we will reevaluate with repeat Doppler and D-dimer.  If negative, she may discontinue anticoagulation.

## 2021-09-23 ENCOUNTER — Inpatient Hospital Stay (HOSPITAL_COMMUNITY): Attending: Physician Assistant | Admitting: Hematology

## 2021-09-23 VITALS — BP 112/76 | HR 83 | Temp 98.7°F | Resp 18 | Ht 67.0 in | Wt 151.8 lb

## 2021-09-23 DIAGNOSIS — Z7901 Long term (current) use of anticoagulants: Secondary | ICD-10-CM | POA: Insufficient documentation

## 2021-09-23 DIAGNOSIS — F17210 Nicotine dependence, cigarettes, uncomplicated: Secondary | ICD-10-CM | POA: Insufficient documentation

## 2021-09-23 DIAGNOSIS — I82441 Acute embolism and thrombosis of right tibial vein: Secondary | ICD-10-CM

## 2021-09-23 DIAGNOSIS — Z8052 Family history of malignant neoplasm of bladder: Secondary | ICD-10-CM | POA: Insufficient documentation

## 2021-09-23 DIAGNOSIS — Z86718 Personal history of other venous thrombosis and embolism: Secondary | ICD-10-CM | POA: Insufficient documentation

## 2021-09-23 DIAGNOSIS — Z800 Family history of malignant neoplasm of digestive organs: Secondary | ICD-10-CM | POA: Diagnosis not present

## 2021-09-23 MED ORDER — APIXABAN 5 MG PO TABS: 5.0000 mg | ORAL_TABLET | Freq: Two times a day (BID) | ORAL | 6 refills | Status: DC

## 2021-09-23 NOTE — Patient Instructions (Signed)
[place] at [facility] Discharge Instructions  You were seen today by Dr. Delton Coombes & Tarri Abernethy PA-C for your right leg blood clot ("DVT/deep vein thrombosis").  Since there are no obvious causes of the blood clot in your right leg, it is considered "unprovoked."  For unprovoked blood clots, we recommend at least 6 months of anticoagulation (blood thinner) before we consider stopping the Eliquis.  You will need to remain on Eliquis until you have been told by our office that you can stop taking it.  In October 2023 (6 months after starting Eliquis), we will check an ultrasound of your right leg to make sure that the blood clot has dissolved.  We will also check labs in October 2023 to see if there are any other blood conditions that may have caused your blood to clot too easily.  We will see you for follow-up about 2 weeks after ultrasound and labs in October 2023.    Thank you for choosing [facility] at [facility] to provide your oncology and hematology care.  To afford each patient quality time with our provider, please arrive at least 15 minutes before your scheduled appointment time.   If you have a lab appointment with the [facility] please come in thru the Main Entrance and check in at the main information desk.  You need to re-schedule your appointment should you arrive 10 or more minutes late.  We strive to give you quality time with our providers, and arriving late affects you and other patients whose appointments are after yours.  Also, if you no show three or more times for appointments you may be dismissed from the clinic at the providers discretion.     Again, thank you for choosing [facility].  Our hope is that these requests will decrease the amount of time that you wait before being seen by our physicians.       _____________________________________________________________  Should you have questions  after your visit to [facility], please contact our office at [phone] and follow the prompts.  Our office hours are 8:00 a.m. and 4:30 p.m. Monday - Friday.  Please note that voicemails left after 4:00 p.m. may not be returned until the following business day.  We are closed weekends and major holidays.  You do have access to a nurse 24-7, just call the main number to the clinic [phone] and do not press any options, hold on the line and a nurse will answer the phone.    For prescription refill requests, have your pharmacy contact our office and allow 72 hours.    Due to Covid, you will need to wear a mask upon entering the hospital. If you do not have a mask, a mask will be given to you at the Main Entrance upon arrival. For doctor visits, patients may have 1 support person age 55 or older with them. For treatment visits, patients can not have anyone with them due to social distancing guidelines and our immunocompromised population.

## 2021-11-10 ENCOUNTER — Other Ambulatory Visit (HOSPITAL_COMMUNITY): Payer: Self-pay | Admitting: Family Medicine

## 2021-11-10 ENCOUNTER — Other Ambulatory Visit: Payer: Self-pay | Admitting: Family Medicine

## 2021-11-10 DIAGNOSIS — R590 Localized enlarged lymph nodes: Secondary | ICD-10-CM

## 2021-11-18 ENCOUNTER — Ambulatory Visit (HOSPITAL_COMMUNITY)
Admission: RE | Admit: 2021-11-18 | Discharge: 2021-11-18 | Disposition: A | Discharge status: Home / Self Care | Source: Ambulatory Visit | Attending: Family Medicine | Admitting: Family Medicine

## 2021-11-18 DIAGNOSIS — R590 Localized enlarged lymph nodes: Secondary | ICD-10-CM | POA: Diagnosis present

## 2022-02-16 ENCOUNTER — Other Ambulatory Visit (HOSPITAL_COMMUNITY)

## 2022-02-16 ENCOUNTER — Inpatient Hospital Stay: Attending: Physician Assistant

## 2022-02-16 ENCOUNTER — Ambulatory Visit (HOSPITAL_COMMUNITY)
Admission: RE | Admit: 2022-02-16 | Discharge: 2022-02-16 | Disposition: A | Discharge status: Home / Self Care | Source: Ambulatory Visit | Attending: Physician Assistant | Admitting: Physician Assistant

## 2022-02-16 DIAGNOSIS — Z800 Family history of malignant neoplasm of digestive organs: Secondary | ICD-10-CM | POA: Insufficient documentation

## 2022-02-16 DIAGNOSIS — Z809 Family history of malignant neoplasm, unspecified: Secondary | ICD-10-CM | POA: Diagnosis not present

## 2022-02-16 DIAGNOSIS — Z7901 Long term (current) use of anticoagulants: Secondary | ICD-10-CM | POA: Insufficient documentation

## 2022-02-16 DIAGNOSIS — Z8052 Family history of malignant neoplasm of bladder: Secondary | ICD-10-CM | POA: Diagnosis not present

## 2022-02-16 DIAGNOSIS — I82441 Acute embolism and thrombosis of right tibial vein: Secondary | ICD-10-CM | POA: Insufficient documentation

## 2022-02-16 DIAGNOSIS — Z86718 Personal history of other venous thrombosis and embolism: Secondary | ICD-10-CM | POA: Diagnosis present

## 2022-02-16 DIAGNOSIS — F17210 Nicotine dependence, cigarettes, uncomplicated: Secondary | ICD-10-CM | POA: Diagnosis not present

## 2022-02-16 LAB — CBC WITH DIFFERENTIAL/PLATELET
Abs Immature Granulocytes: 0.03 10*3/uL (ref 0.00–0.07)
Basophils Absolute: 0.1 10*3/uL (ref 0.0–0.1)
Basophils Relative: 1 %
Eosinophils Absolute: 0.2 10*3/uL (ref 0.0–0.5)
Eosinophils Relative: 2 %
HCT: 36.9 % (ref 36.0–46.0)
Hemoglobin: 12.2 g/dL (ref 12.0–15.0)
Immature Granulocytes: 0 %
Lymphocytes Relative: 39 %
Lymphs Abs: 3.2 10*3/uL (ref 0.7–4.0)
MCH: 31.6 pg (ref 26.0–34.0)
MCHC: 33.1 g/dL (ref 30.0–36.0)
MCV: 95.6 fL (ref 80.0–100.0)
Monocytes Absolute: 0.5 10*3/uL (ref 0.1–1.0)
Monocytes Relative: 6 %
Neutro Abs: 4.3 10*3/uL (ref 1.7–7.7)
Neutrophils Relative %: 52 %
Platelets: 218 10*3/uL (ref 150–400)
RBC: 3.86 MIL/uL — ABNORMAL LOW (ref 3.87–5.11)
RDW: 13.9 % (ref 11.5–15.5)
WBC: 8.2 10*3/uL (ref 4.0–10.5)
nRBC: 0 % (ref 0.0–0.2)

## 2022-02-16 LAB — D-DIMER, QUANTITATIVE: D-Dimer, Quant: <0.27 ug/mL-FEU (ref 0.00–0.50)

## 2022-02-21 LAB — FACTOR 5 LEIDEN

## 2022-03-02 ENCOUNTER — Telehealth: Admitting: Physician Assistant

## 2022-03-03 ENCOUNTER — Other Ambulatory Visit (HOSPITAL_COMMUNITY): Payer: Self-pay | Admitting: Family Medicine

## 2022-03-03 DIAGNOSIS — Z1231 Encounter for screening mammogram for malignant neoplasm of breast: Secondary | ICD-10-CM

## 2022-03-15 ENCOUNTER — Ambulatory Visit (INDEPENDENT_AMBULATORY_CARE_PROVIDER_SITE_OTHER)

## 2022-03-15 ENCOUNTER — Ambulatory Visit (INDEPENDENT_AMBULATORY_CARE_PROVIDER_SITE_OTHER): Admitting: Orthopedic Surgery

## 2022-03-15 ENCOUNTER — Encounter: Payer: Self-pay | Admitting: Orthopedic Surgery

## 2022-03-15 VITALS — BP 146/77 | HR 71 | Ht 67.0 in | Wt 154.0 lb

## 2022-03-15 DIAGNOSIS — M5412 Radiculopathy, cervical region: Secondary | ICD-10-CM | POA: Diagnosis not present

## 2022-03-15 DIAGNOSIS — M542 Cervicalgia: Secondary | ICD-10-CM | POA: Diagnosis not present

## 2022-03-15 DIAGNOSIS — M79602 Pain in left arm: Secondary | ICD-10-CM | POA: Diagnosis not present

## 2022-03-15 DIAGNOSIS — M25512 Pain in left shoulder: Secondary | ICD-10-CM | POA: Diagnosis not present

## 2022-03-15 MED ORDER — DIAZEPAM 5 MG PO TABS: 5.0000 mg | ORAL_TABLET | Freq: Once | ORAL | 0 refills | Status: AC

## 2022-03-15 NOTE — Progress Notes (Signed)
New Patient Visit  Assessment: Kristen Lang is a 61 y.o. RHD female with the following: 1. Left shoulder pain, unspecified chronicity 2. Cervical radiculopathy  Plan: Kristen Lang is complaining of pain in her left arm.  In addition, she has weakness on physical exam, with atrophy of the left shoulder and upper arm muscles.  She states that she had an MRI over 10 years ago, which demonstrated a herniated disc.  Radiographs obtained in clinic today demonstrates mild kyphosis of the cervical spine, with degenerative changes at multiple levels.  There are large anterior osteophytes, especially at C4-5, C5-6, C6-7.  Based on the constellation of symptoms, I am concerned that there is nerve compression in her cervical spine.  As result, I am recommending a cervical spine MRI.  We will place an order today.  I provided her with a prescription for Valium, as she states she has claustrophobia.  Once the images are available, we will see her back in clinic to discuss the results.  If she has the MRI at an outside facility, she will have to bring the images with her.  Follow-up: Return for After MRI.  Subjective:  Chief Complaint  Patient presents with   Left Shoulder - Pain    Pain and knot left biceps area / has a knot     History of Present Illness: Kristen Lang is a 61 y.o. female who has been referred by Mina Marble, DO for evaluation of left shoulder pain.  She states she has had pain and weakness in the left shoulder for the past 3 months.  She has tried doing some home exercises, but this is not improving her weakness.  At times, she has pain radiating from her shoulder into her left hand.  Occasional numbness in the left thumb area.  Of note, she reports that she had an MRI of the cervical spine greater than 10 years ago.  She was told that she had a herniated disc at that point.  No pain in her neck.  She denies any injury to her left shoulder, and her cervical spine.  She  denies issues with her balance.   Review of Systems: No fevers or chills + numbness & tingling No chest pain No shortness of breath No bowel or bladder dysfunction No GI distress No headaches   Medical History:  Past Medical History:  Diagnosis Date   Allergy     Past Surgical History:  Procedure Laterality Date   ABDOMINAL HYSTERECTOMY     COLONOSCOPY WITH PROPOFOL N/A 04/16/2019   Procedure: COLONOSCOPY WITH PROPOFOL;  Surgeon: Danie Binder, MD;  Location: [facility];  Service: Endoscopy;  Laterality: N/A;  10:30am    Family History  Problem Relation Age of Onset   Diabetes Mother    Hypertension Mother    Cancer Father    Hypertension Sister    Heart disease Sister    Hypertension Brother    Diabetes Brother    Cancer Maternal Grandfather    Colon cancer Paternal Grandfather        after age 81   Hypertension Sister    Hypertension Sister    Hypertension Sister    Hypertension Brother    Heart disease Brother    Hypertension Brother    Hypertension Brother    Drug abuse Brother    Social History   Tobacco Use   Smoking status: Every Day    Packs/day: 0.25    Years: 9.00    Total pack  years: 2.25    Types: Cigarettes   Smokeless tobacco: Never  Vaping Use   Vaping Use: Never used  Substance Use Topics   Alcohol use: Yes    Comment: 2-3 12 oz beer a day   Drug use: No    Allergies  Allergen Reactions   Dilaudid [Hydromorphone Hcl]     Itching, "causes me to break out"   Hydrocodone     "makes me crazy"    Current Meds  Medication Sig   diazepam (VALIUM) 5 MG tablet Take 1 tablet (5 mg total) by mouth once for 1 dose. Take within 60 minutes of the procedure/MRI    Objective: BP (!) 146/77   Pulse 71   Ht '5\' 7"'$  (1.702 m)   Wt 154 lb (69.9 kg)   BMI 24.12 kg/m   Physical Exam:  General: Alert and oriented. and No acute distress. Gait: Normal gait.  Evaluation left shoulder demonstrates atrophy.  She has slightly restricted  range of motion of the left shoulder compared to the right.  Restricted cervical spine range of motion in all directions.  No pain in the neck.  4/5 strength in the deltoid, supraspinatus and infraspinatus.  Negative Hoffmann's.   IMAGING: I personally ordered and reviewed the following images  X-ray of the cervical spine were obtained in clinic today.  No acute injuries are noted.  Loss of normal curvature, with mild kyphosis, at the superior aspect of the cervical spine.  Diffuse degenerative changes throughout, with large anterior osteophytes at C4-5, C5-6 and C6-7.  Impression: Cervical spine with loss of normal curvature, and advanced degenerative changes   New Medications:  Meds ordered this encounter  Medications   diazepam (VALIUM) 5 MG tablet    Sig: Take 1 tablet (5 mg total) by mouth once for 1 dose. Take within 60 minutes of the procedure/MRI    Dispense:  1 tablet    Refill:  0      Mordecai Rasmussen, MD  03/15/2022 10:21 AM

## 2022-03-16 NOTE — Progress Notes (Unsigned)
[place], [place] [address]   CLINIC:  Medical Oncology/Hematology  PCP:  Danna Hefty, DO [address] [place] [address] [phone]   REASON FOR VISIT:  Follow-up for right lower extremity DVT  CURRENT THERAPY: Eliquis  INTERVAL HISTORY:  Ms. Axelson 61 y.o. female returns for routine follow-up of right lower extremity DVT.  She was last seen by Dr. Delton Coombes and Tarri Abernethy PA-C on 09/23/2021.  At today's visit, she reports feeling well.  No recent hospitalizations, surgeries, or changes in baseline health status.  She finished her 6 months of Eliquis in October. She denies any abnormal bleeding such as bright red blood per rectum or melena.  No unilateral leg swelling/pain, chest pain, dyspnea, or hemoptysis.  She has 100% energy and 100% appetite. She endorses that she is maintaining a stable weight.   REVIEW OF SYSTEMS: Patient denies any acute complaints at today's visit. Review of Systems  Constitutional:  Negative for appetite change, chills, diaphoresis, fatigue, fever and unexpected weight change.  HENT:   Negative for lump/mass and nosebleeds.   Eyes:  Negative for eye problems.  Respiratory:  Negative for cough, hemoptysis and shortness of breath.   Cardiovascular:  Negative for chest pain, leg swelling and palpitations.  Gastrointestinal:  Negative for abdominal pain, blood in stool, constipation, diarrhea, nausea and vomiting.  Genitourinary:  Negative for hematuria.   Musculoskeletal:  Negative for myalgias (right leg pain).  Skin: Negative.   Neurological:  Negative for dizziness, headaches, light-headedness and numbness.  Hematological:  Does not bruise/bleed easily.      PAST MEDICAL/SURGICAL HISTORY:  Past Medical History:  Diagnosis Date   Allergy    Past Surgical History:  Procedure Laterality Date   ABDOMINAL HYSTERECTOMY     COLONOSCOPY WITH PROPOFOL N/A 04/16/2019   Procedure:  COLONOSCOPY WITH PROPOFOL;  Surgeon: Danie Binder, MD;  Location: [facility];  Service: Endoscopy;  Laterality: N/A;  10:30am     SOCIAL HISTORY:  Social History   Socioeconomic History   Marital status: Legally Separated    Spouse name: Not on file   Number of children: Not on file   Years of education: Not on file   Highest education level: Not on file  Occupational History   Not on file  Tobacco Use   Smoking status: Every Day    Packs/day: 0.25    Years: 9.00    Total pack years: 2.25    Types: Cigarettes   Smokeless tobacco: Never  Vaping Use   Vaping Use: Never used  Substance and Sexual Activity   Alcohol use: Yes    Comment: 2-3 12 oz beer a day   Drug use: No   Sexual activity: Yes    Birth control/protection: Surgical  Other Topics Concern   Not on file  Social History Narrative   Not on file   Social Determinants of Health   Financial Resource Strain: Not on file  Food Insecurity: Not on file  Transportation Needs: Not on file  Physical Activity: Not on file  Stress: Not on file  Social Connections: Not on file  Intimate Partner Violence: Not on file    FAMILY HISTORY:  Family History  Problem Relation Age of Onset   Diabetes Mother    Hypertension Mother    Cancer Father    Hypertension Sister    Heart disease Sister    Hypertension Brother    Diabetes Brother  Cancer Maternal Grandfather    Colon cancer Paternal Grandfather        after age 64   Hypertension Sister    Hypertension Sister    Hypertension Sister    Hypertension Brother    Heart disease Brother    Hypertension Brother    Hypertension Brother    Drug abuse Brother     CURRENT MEDICATIONS:  Outpatient Encounter Medications as of 03/17/2022  Medication Sig   apixaban (ELIQUIS) 5 MG TABS tablet Take 1 tablet (5 mg total) by mouth 2 (two) times daily. (Patient not taking: Reported on 03/15/2022)   Cod Liver Oil CAPS Take 1 capsule by mouth daily.  (Patient not  taking: Reported on 03/15/2022)   No facility-administered encounter medications on file as of 03/17/2022.    ALLERGIES:  Allergies  Allergen Reactions   Dilaudid [Hydromorphone Hcl]     Itching, "causes me to break out"   Hydrocodone     "makes me crazy"     PHYSICAL EXAM:  ECOG PERFORMANCE STATUS: 0 - Asymptomatic  There were no vitals filed for this visit. There were no vitals filed for this visit. Physical Exam Constitutional:      Appearance: Normal appearance.  HENT:     Head: Normocephalic and atraumatic.     Mouth/Throat:     Mouth: Mucous membranes are moist.  Eyes:     Extraocular Movements: Extraocular movements intact.     Pupils: Pupils are equal, round, and reactive to light.  Cardiovascular:     Rate and Rhythm: Normal rate and regular rhythm.     Pulses: Normal pulses.     Heart sounds: Normal heart sounds.  Pulmonary:     Effort: Pulmonary effort is normal.     Breath sounds: Normal breath sounds.  Abdominal:     General: Bowel sounds are normal.     Palpations: Abdomen is soft.     Tenderness: There is no abdominal tenderness.  Musculoskeletal:        General: No swelling.     Right lower leg: No edema (Previous RLE edema has resolved).     Left lower leg: No edema.  Lymphadenopathy:     Cervical: No cervical adenopathy.  Skin:    General: Skin is warm and dry.  Neurological:     General: No focal deficit present.     Mental Status: She is alert and oriented to person, place, and time.  Psychiatric:        Mood and Affect: Mood normal.        Behavior: Behavior normal.      LABORATORY DATA:  I have reviewed the labs as listed.  CBC    Component Value Date/Time   WBC 8.2 02/16/2022 1031   RBC 3.86 (L) 02/16/2022 1031   HGB 12.2 02/16/2022 1031   HCT 36.9 02/16/2022 1031   PLT 218 02/16/2022 1031   MCV 95.6 02/16/2022 1031   MCV 92.2 04/25/2015 1628   MCH 31.6 02/16/2022 1031   MCHC 33.1 02/16/2022 1031   RDW 13.9 02/16/2022 1031    LYMPHSABS 3.2 02/16/2022 1031   MONOABS 0.5 02/16/2022 1031   EOSABS 0.2 02/16/2022 1031   BASOSABS 0.1 02/16/2022 1031      Latest Ref Rng & Units 08/17/2021    2:35 PM 05/11/2020    2:45 AM 11/13/2019    5:01 AM  CMP  Glucose 70 - 99 mg/dL 105  109  123   BUN 6 - 20 mg/dL  $'16  16  11   'w$ Creatinine 0.44 - 1.00 mg/dL 0.75  0.87  0.82   Sodium 135 - 145 mmol/L 138  138  134   Potassium 3.5 - 5.1 mmol/L 3.9  3.9  3.7   Chloride 98 - 111 mmol/L 107  104  100   CO2 22 - 32 mmol/L '26  26  25   '$ Calcium 8.9 - 10.3 mg/dL 8.9  9.4  9.0   Total Protein 6.5 - 8.1 g/dL  7.2  7.3   Total Bilirubin 0.3 - 1.2 mg/dL  0.5  0.8   Alkaline Phos 38 - 126 U/L  61  71   AST 15 - 41 U/L  17  26   ALT 0 - 44 U/L  15  16     DIAGNOSTIC IMAGING:  I have independently reviewed the relevant imaging and discussed with the patient.  ASSESSMENT & PLAN: 1.  Right lower extremity DVT, unprovoked - Referred by ED provider - ED visit on 08/17/2021 due to right leg pain.  Venous duplex ultrasound showed small occlusive DVT of anterior tibial vein of the right calf. - Patient was placed on Eliquis since 08/17/2021.  She is tolerating Eliquis well without any major bleeding events. - No obvious provoking factors such as recent surgery, travel, prolonged immobility, hospitalization, hormone supplements, injury, or malignancy. - Her only questionable provoking factor is that she falls asleep in a seated position with her legs hanging down several times per week, sometimes will remain in that position for 4 to 6 hours. -No prior history of DVT, PE, or miscarriages.  No personal history of autoimmune or rheumatoid disorders.  No family history of VTE or miscarriages. - No B symptoms.  She is up-to-date on age-appropriate screenings including mammogram and colonoscopy. - Labs done by her PCP (08/19/2020) were negative for PT gene mutation, cardiolipin antibodies, and beta-2 glycoprotein antibodies. - Even though she had distal  DVT with very low thrombus burden, 6 months of anticoagulation was recommended by Dr. Delton Coombes as DVT was apparently unprovoked. - Labs from 02/16/2022 show undetectable D-dimer (<0.27), factor V Leiden mutation NEGATIVE.  CBC unremarkable. - Right leg venous US (02/16/2022) is negative for DVT, with interval resolution of right sided tibial DVT seen on prior study. - PLAN: Per discussion with Dr. Delton Coombes, patient may now discontinue anticoagulation since she has completed treatment x6 months and repeat ultrasound showed resolution of DVT. - Since initial DVT suspected to be unprovoked, we will also check lupus anticoagulant, protein C, protein S, Antithrombin III in 30 days (previously checked by PCP, but unreliable since patient was on Eliquis at the time of lab test) - Discussed with patient that she does remain at slightly increased risk of DVT/PE compared to the general population.  If she has any recurrent DVT or PE in the future she would warrant lifelong anticoagulation. - Patient has been counseled to avoid prolonged sitting. - Phone visit in 6 weeks (2 weeks after lab check)   2.  Other history - PMH: Seasonal allergies - SOCIAL: She works as Dealer of a Copywriter, advertising.  She smokes 0.5 PPD since 2006 (8.5-pack-year history).  She drinks 1-2 beers daily.  She denies any illicit drug use. - FAMILY: She denies any family history of DVT, PE, or miscarriages.  She does report family history of malignancy - father had unspecified cancer, paternal uncle had bladder cancer, paternal grandfather had colon cancer.   PLAN SUMMARY & DISPOSITION: >> Stop  Eliquis >> Labs in 5 weeks (lupus anticoagulant, protein C, protein S, Antithrombin III) >> PHONE visit 2 weeks after labs  All questions were answered. The patient knows to call the clinic with any problems, questions or concerns.  Medical decision making: Low  Time spent on visit: I spent 15 minutes counseling the patient face to  face. The total time spent in the appointment was 22 minutes and more than 50% was on counseling.   Harriett Rush, PA-C  03/17/2022 9:44 AM

## 2022-03-17 ENCOUNTER — Inpatient Hospital Stay: Attending: Physician Assistant | Admitting: Physician Assistant

## 2022-03-17 ENCOUNTER — Other Ambulatory Visit: Payer: Self-pay

## 2022-03-17 VITALS — BP 150/84 | HR 70 | Temp 98.0°F | Resp 17 | Ht 67.0 in | Wt 149.6 lb

## 2022-03-17 DIAGNOSIS — Z86718 Personal history of other venous thrombosis and embolism: Secondary | ICD-10-CM | POA: Diagnosis not present

## 2022-03-17 DIAGNOSIS — Z800 Family history of malignant neoplasm of digestive organs: Secondary | ICD-10-CM | POA: Insufficient documentation

## 2022-03-17 DIAGNOSIS — F17210 Nicotine dependence, cigarettes, uncomplicated: Secondary | ICD-10-CM | POA: Insufficient documentation

## 2022-03-17 DIAGNOSIS — I82441 Acute embolism and thrombosis of right tibial vein: Secondary | ICD-10-CM

## 2022-03-17 DIAGNOSIS — Z809 Family history of malignant neoplasm, unspecified: Secondary | ICD-10-CM | POA: Diagnosis not present

## 2022-03-17 DIAGNOSIS — Z7901 Long term (current) use of anticoagulants: Secondary | ICD-10-CM | POA: Insufficient documentation

## 2022-03-17 NOTE — Patient Instructions (Signed)
Kristen Lang at [facility] Discharge Instructions  You were seen today by Tarri Abernethy PA-C for your right leg blood clot ("DVT/deep vein thrombosis").  You have finished your 6 months of Eliquis.  You do NOT need to take Eliquis anymore at this time.  Your most recent ultrasound did not show any blood clot in your leg.  Your labs did not show any obvious reason for you to have a blood clot.  Since there are no obvious causes of the blood clot in your right leg, it is considered "unprovoked."  For unprovoked blood clots, you do have a slightly increased risk of developing another blood clot in the future.  If that happens, you would likely need to be on Eliquis or a similar blood thinner lifelong.  We will check additional labs in 1 month to look for other blood disorders that can cause blood clots.  We will call you to discuss these results.  ** Thank you for trusting me with your healthcare!  I strive to provide all of my patients with quality care at each visit.  If you receive a survey for this visit, I would be so grateful to you for taking the time to provide feedback.  Thank you in advance!  ~ Kaylani Fromme                   Dr. Derek Jack   &   Tarri Abernethy, PA-C   - - - - - - - - - - - - - - - - - -     Thank you for choosing [facility] to provide your oncology and hematology care.  To afford each patient quality time with our provider, please arrive at least 15 minutes before your scheduled appointment time.   If you have a lab appointment with the [facility] please come in thru the Main Entrance and check in at the main information desk.  You need to re-schedule your appointment should you arrive 10 or more minutes late.  We strive to give you quality time with our providers, and arriving late affects you and other patients whose appointments are after yours.  Also, if you no show three or more times for  appointments you may be dismissed from the clinic at the providers discretion.     Again, thank you for choosing [facility].  Our hope is that these requests will decrease the amount of time that you wait before being seen by our physicians.       _____________________________________________________________  Should you have questions after your visit to [facility], please contact our office at [phone] and follow the prompts.  Our office hours are 8:00 a.m. and 4:30 p.m. Monday - Friday.  Please note that voicemails left after 4:00 p.m. may not be returned until the following business day.  We are closed weekends and major holidays.  You do have access to a nurse 24-7, just call the main number to the clinic [phone] and do not press any options, hold on the line and a nurse will answer the phone.    For prescription refill requests, have your pharmacy contact our office and allow 72 hours.    Due to Covid, you will need to wear a mask upon entering the hospital. If you do not have a mask, a mask will be given to you at the Main Entrance upon arrival. For doctor  visits, patients may have 1 support person age 41 or older with them. For treatment visits, patients can not have anyone with them due to social distancing guidelines and our immunocompromised population.

## 2022-03-21 ENCOUNTER — Ambulatory Visit (HOSPITAL_COMMUNITY)
Admission: RE | Admit: 2022-03-21 | Discharge: 2022-03-21 | Disposition: A | Discharge status: Home / Self Care | Source: Ambulatory Visit | Attending: Family Medicine | Admitting: Family Medicine

## 2022-03-21 DIAGNOSIS — Z1231 Encounter for screening mammogram for malignant neoplasm of breast: Secondary | ICD-10-CM | POA: Diagnosis present

## 2022-03-23 ENCOUNTER — Ambulatory Visit (HOSPITAL_COMMUNITY): Attending: Family Medicine | Admitting: Occupational Therapy

## 2022-03-23 DIAGNOSIS — M25612 Stiffness of left shoulder, not elsewhere classified: Secondary | ICD-10-CM | POA: Diagnosis present

## 2022-03-23 DIAGNOSIS — M25512 Pain in left shoulder: Secondary | ICD-10-CM | POA: Diagnosis not present

## 2022-03-23 DIAGNOSIS — R29898 Other symptoms and signs involving the musculoskeletal system: Secondary | ICD-10-CM | POA: Diagnosis present

## 2022-03-23 NOTE — Therapy (Signed)
OUTPATIENT OCCUPATIONAL THERAPY ORTHO EVALUATION  Patient Name: Nyesha Cliff MRN: [id number] DOB:1960/05/13, 61 y.o., female Today's Date: 03/24/2022  PCP: Danna Hefty, DO REFERRING PROVIDER: Larena Glassman, MD   OT End of Session - 03/24/22 1139     Visit Number 1    Number of Visits 9    Date for OT Re-Evaluation 04/22/22    Authorization Type [employer], no copay?    Authorization Time Period No visit limit?    OT Start Time 0902    OT Stop Time [phone]    OT Time Calculation (min) 40 min    Activity Tolerance Patient tolerated treatment well    Behavior During Therapy WFL for tasks assessed/performed            Past Medical History:  Diagnosis Date   Allergy    Past Surgical History:  Procedure Laterality Date   ABDOMINAL HYSTERECTOMY     COLONOSCOPY WITH PROPOFOL N/A 04/16/2019   Procedure: COLONOSCOPY WITH PROPOFOL;  Surgeon: Danie Binder, MD;  [facility];  Service: Endoscopy;  Laterality: N/A;  10:30am   Patient Active Problem List   Diagnosis Date Noted   Cervical radiculopathy 03/15/2022   Lumbar radiculopathy 08/05/2021   Acquired absence of uterus 05/11/2020   Body mass index (BMI) 24.0-24.9, adult 05/11/2020   Grief 05/11/2020   Left knee pain 05/11/2020   Tobacco use 05/11/2020   Trichomonal cervicitis 05/11/2020   Colon cancer screening 01/25/2019   Pelvic mass in female 03/13/2013    ONSET DATE: April 2023  REFERRING DIAG: L shoulder pain  THERAPY DIAG:  Acute pain of left shoulder - Plan: Ot plan of care cert/re-cert  Stiffness of left shoulder, not elsewhere classified - Plan: Ot plan of care cert/re-cert  Other symptoms and signs involving the musculoskeletal system - Plan: Ot plan of care cert/re-cert  Rationale for Evaluation and Treatment: Rehabilitation  SUBJECTIVE:   SUBJECTIVE STATEMENT: "Any sort of movement and it hurts worse and worse." Pt accompanied by: self  PERTINENT HISTORY: Pt began having  pain in her biceps and was unable to lift anything back in the Spring. This pain has worsened and travelled to her shoulder.   PRECAUTIONS: Shoulder  WEIGHT BEARING RESTRICTIONS: No  PAIN:  Are you having pain? Yes: NPRS scale: 2/10 Pain location: Biceps and forearms Pain description: achy and locks up Aggravating factors: movement and lifting Relieving factors: none  FALLS: Has patient fallen in last 6 months? No  LIVING ENVIRONMENT: Lives with: lives alone Lives in: House/apartment  PLOF: Independent  PATIENT GOALS: To be able to use my arm and ride my motorcycle again   OBJECTIVE:   HAND DOMINANCE: Right  ADLs: Overall ADLs: Pt having increased difficulty getting dressed, bathing, grooming, difficulty pulling pants up, doing buttons and zippers, unable to use LUE to assist with any bathing.   FUNCTIONAL OUTCOME MEASURES: FOTO: 31.82  UPPER EXTREMITY ROM:     Active ROM Left eval  Shoulder flexion 85  Shoulder abduction 73  Shoulder internal rotation 55  Shoulder external rotation 50  Elbow flexion 71  Elbow extension 0  (Blank rows = not tested) UPPER EXTREMITY MMT:     MMT Right eval Left eval  Shoulder flexion    Shoulder abduction    Shoulder adduction    Shoulder extension    Shoulder internal rotation    Shoulder external rotation    Middle trapezius    Lower trapezius    Elbow flexion  Elbow extension    Wrist flexion    Wrist extension    Wrist ulnar deviation    Wrist radial deviation    Wrist pronation    Wrist supination    (Blank rows = not tested)  SENSATION: Pt reporting tingling throughout the medial aspect of LUE  EDEMA: None noted this session  OBSERVATIONS: Pt has severe fascial restrictions of the biceps and deltoid   TODAY'S TREATMENT:                                                                                                                              DATE: 03/23/22: Evaluation Only    PATIENT  EDUCATION: Education details: Pendulums Person educated: Patient Education method: Customer service manager Education comprehension: verbalized understanding and returned demonstration  HOME EXERCISE PROGRAM: 11/15: Pendulums  GOALS: Goals reviewed with patient? Yes  SHORT TERM GOALS: Target date:  04/22/22     Pt will be provided with and educated on HEP to improve mobility in the LUE required for ADL completion.  Goal status: INITIAL  2.  Pt will decrease pain in LUE to 4/10 or less in order to sleep for 3+ consecutive hours without waking due to pain.  Goal status: INITIAL  3.  Pt will decrease fascial restrictions to minimal amounts or less to improve mobility required for overhead reaching tasks.  Goal status: INITIAL  4.  Pt will increase A/ROM of LUE to [facility] to improve ability to reach overhead and behind back during dressing and bathing tasks.  Goal status: INITIAL  5.  Pt will improve strength in LUE to 4+/5 to improve ability to perform lifting tasks required during cooking and cleaning tasks.  Goal status: INITIAL   ASSESSMENT:  CLINICAL IMPRESSION: Patient is a 61 y.o. F who was seen today for occupational therapy evaluation for L shoulder pain. Pt reports that prior to her shoulder pain she was independent with all ADL's and IADL's. At this time due to pain and ROM limitations, she is unable to complete most IADL's without assist and increased time, as well as severely increased pain. Additionally she reports dressing and bathing are becoming more and more difficult for her as well. She will benefit from skilled OT to maximize strength and ROM while limiting pain, in order to improve her independence with ADL's and IADL's.    PERFORMANCE DEFICITS: in functional skills including ADLs, IADLs, sensation, ROM, strength, pain, fascial restrictions, Gross motor control, body mechanics, and UE functional use.  IMPAIRMENTS: are limiting patient from ADLs, IADLs, rest and  sleep, work, leisure, and social participation.   COMORBIDITIES: has no other co-morbidities that affects occupational performance. Patient will benefit from skilled OT to address above impairments and improve overall function.  MODIFICATION OR ASSISTANCE TO COMPLETE EVALUATION: No modification of tasks or assist necessary to complete an evaluation.  OT OCCUPATIONAL PROFILE AND HISTORY: Problem focused assessment: Including review of records relating to presenting problem.  CLINICAL  DECISION MAKING: LOW - limited treatment options, no task modification necessary  REHAB POTENTIAL: Good  EVALUATION COMPLEXITY: Low      PLAN:  OT FREQUENCY: 2x/week  OT DURATION: 4 weeks  PLANNED INTERVENTIONS: self care/ADL training, therapeutic exercise, therapeutic activity, manual therapy, passive range of motion, functional mobility training, ultrasound, moist heat, cryotherapy, patient/family education, energy conservation, and DME and/or AE instructions  RECOMMENDED OTHER SERVICES: N/A  CONSULTED AND AGREED WITH PLAN OF CARE: Patient  PLAN FOR NEXT SESSION: Manual Therapy, P/ROM, AA/ROM, isometrics, wall slides, table slides   Paulita Fujita, OTR/L [facility] [place], [place] 03/24/2022, 12:18 PM

## 2022-03-24 ENCOUNTER — Encounter (HOSPITAL_COMMUNITY): Payer: Self-pay | Admitting: Occupational Therapy

## 2022-03-28 ENCOUNTER — Ambulatory Visit (HOSPITAL_COMMUNITY): Admitting: Occupational Therapy

## 2022-03-28 DIAGNOSIS — R29898 Other symptoms and signs involving the musculoskeletal system: Secondary | ICD-10-CM

## 2022-03-28 DIAGNOSIS — M25612 Stiffness of left shoulder, not elsewhere classified: Secondary | ICD-10-CM

## 2022-03-28 DIAGNOSIS — M25512 Pain in left shoulder: Secondary | ICD-10-CM

## 2022-03-28 NOTE — Therapy (Unsigned)
OUTPATIENT OCCUPATIONAL THERAPY ORTHO TREATMENT NOTE  Patient Name: Kristen Lang MRN: [id number] DOB:05/30/1960, 61 y.o., female Today's Date: 03/29/2022  PCP: Danna Hefty, DO REFERRING PROVIDER: Larena Glassman, MD   OT End of Session - 03/28/22 0820     Visit Number 2    Number of Visits 9    Date for OT Re-Evaluation 04/22/22    Authorization Type [employer], no copay?    Authorization Time Period No visit limit?    OT Start Time [phone]    OT Stop Time 0821    OT Time Calculation (min) 38 min    Activity Tolerance Patient tolerated treatment well    Behavior During Therapy WFL for tasks assessed/performed             Past Medical History:  Diagnosis Date   Allergy    Past Surgical History:  Procedure Laterality Date   ABDOMINAL HYSTERECTOMY     COLONOSCOPY WITH PROPOFOL N/A 04/16/2019   Procedure: COLONOSCOPY WITH PROPOFOL;  Surgeon: Danie Binder, MD;  [facility];  Service: Endoscopy;  Laterality: N/A;  10:30am   Patient Active Problem List   Diagnosis Date Noted   Cervical radiculopathy 03/15/2022   Lumbar radiculopathy 08/05/2021   Acquired absence of uterus 05/11/2020   Body mass index (BMI) 24.0-24.9, adult 05/11/2020   Grief 05/11/2020   Left knee pain 05/11/2020   Tobacco use 05/11/2020   Trichomonal cervicitis 05/11/2020   Colon cancer screening 01/25/2019   Pelvic mass in female 03/13/2013    ONSET DATE: April 2023  REFERRING DIAG: L shoulder pain  THERAPY DIAG:  Acute pain of left shoulder  Stiffness of left shoulder, not elsewhere classified  Other symptoms and signs involving the musculoskeletal system  Rationale for Evaluation and Treatment: Rehabilitation  SUBJECTIVE:   SUBJECTIVE STATEMENT: "I just have pain in my bicep all the time"  PERTINENT HISTORY: Pt began having pain in her biceps and was unable to lift anything back in the Spring. This pain has worsened and travelled to her shoulder.    PRECAUTIONS: Shoulder  WEIGHT BEARING RESTRICTIONS: No  PAIN:  Are you having pain? Yes: NPRS scale: 5/10 Pain location: Biceps and forearms Pain description: achy and locks up Aggravating factors: movement and lifting Relieving factors: none  FALLS: Has patient fallen in last 6 months? No  PATIENT GOALS: To be able to use my arm and ride my motorcycle again   OBJECTIVE:   HAND DOMINANCE: Right  ADLs: Overall ADLs: Pt having increased difficulty getting dressed, bathing, grooming, difficulty pulling pants up, doing buttons and zippers, unable to use LUE to assist with any bathing.   FUNCTIONAL OUTCOME MEASURES: FOTO: 31.82  UPPER EXTREMITY ROM:     Active ROM Left eval  Shoulder flexion 85  Shoulder abduction 73  Shoulder internal rotation 55  Shoulder external rotation 50  Elbow flexion 71  Elbow extension 0  (Blank rows = not tested) UPPER EXTREMITY MMT:     MMT Left eval  Shoulder flexion   Shoulder abduction   Shoulder adduction   Shoulder extension   Shoulder internal rotation   Shoulder external rotation   Elbow flexion   Elbow extension   (Blank rows = not tested)  SENSATION: Pt reporting tingling throughout the medial aspect of LUE  EDEMA: None noted this session  OBSERVATIONS: Pt has severe fascial restrictions of the biceps and deltoid   TODAY'S TREATMENT:  DATE:  03/28/22 -Manual Therapy: myofascial release and trigger point applied to the biceps, axillary region, trapezius, and scapular region -P/ROM: supine, flexion, abduction, horizontal abduction, er/IR, 1x10 -AA/ROM: supine, flexion, abduction, protraction, horizontal abduction, er/IR, 1x10 -Wall Slides: flexion and abduction, 1x10 -Isometrics: flexion, extension, abduction, adduction, er, IR, 5x8"   PATIENT EDUCATION: Education details: Wall  Slides Person educated: Patient Education method: Explanation and Demonstration Education comprehension: verbalized understanding and returned demonstration  HOME EXERCISE PROGRAM: 11/15: Pendulums 11/20: Wall Slides  GOALS: Goals reviewed with patient? Yes  SHORT TERM GOALS: Target date:  04/22/22     Pt will be provided with and educated on HEP to improve mobility in the LUE required for ADL completion.  Goal status: IN PROGRESS  2.  Pt will decrease pain in LUE to 4/10 or less in order to sleep for 3+ consecutive hours without waking due to pain.  Goal status: IN PROGRESS  3.  Pt will decrease fascial restrictions to minimal amounts or less to improve mobility required for overhead reaching tasks.  Goal status: IN PROGRESS  4.  Pt will increase A/ROM of LUE to [facility] to improve ability to reach overhead and behind back during dressing and bathing tasks.  Goal status: IN PROGRESS  5.  Pt will improve strength in LUE to 4+/5 to improve ability to perform lifting tasks required during cooking and cleaning tasks.  Goal status: IN PROGRESS   ASSESSMENT:  CLINICAL IMPRESSION: Pt presents to session with increased fascial restrictions and tightness in her biceps and axillary region. Therapist providing manual therapy and P/ROM to assist with loosening up and decreasing fascial restrictions. This session focused on ROM and encouraging pt to stretch/open up. Pt requiring verbal cuing for technique and positioning. She will continue to benefit from skilled OT to maximize strength and ROM in order to improve independence.   PLAN:  OT FREQUENCY: 2x/week  OT DURATION: 4 weeks  PLANNED INTERVENTIONS: self care/ADL training, therapeutic exercise, therapeutic activity, manual therapy, passive range of motion, functional mobility training, ultrasound, moist heat, cryotherapy, patient/family education, energy conservation, and DME and/or AE instructions  RECOMMENDED OTHER SERVICES:  N/A  CONSULTED AND AGREED WITH PLAN OF CARE: Patient  PLAN FOR NEXT SESSION: Manual Therapy, P/ROM, AA/ROM, isometrics, wall slides, table slides   Paulita Fujita, OTR/L [place], [place] 03/29/2022, 8:52 AM

## 2022-03-29 ENCOUNTER — Encounter (HOSPITAL_COMMUNITY): Payer: Self-pay | Admitting: Occupational Therapy

## 2022-03-30 ENCOUNTER — Ambulatory Visit (HOSPITAL_COMMUNITY): Admitting: Occupational Therapy

## 2022-03-30 DIAGNOSIS — R29898 Other symptoms and signs involving the musculoskeletal system: Secondary | ICD-10-CM

## 2022-03-30 DIAGNOSIS — M25512 Pain in left shoulder: Secondary | ICD-10-CM | POA: Diagnosis not present

## 2022-03-30 DIAGNOSIS — M25612 Stiffness of left shoulder, not elsewhere classified: Secondary | ICD-10-CM

## 2022-03-30 NOTE — Therapy (Signed)
OUTPATIENT OCCUPATIONAL THERAPY ORTHO TREATMENT NOTE  Patient Name: Kristen Lang MRN: [id number] DOB:[birth year], 61 y.o., female Today's Date: 03/31/2022  PCP: Danna Hefty, DO REFERRING PROVIDER: Larena Glassman, MD   OT End of Session - 03/30/22 1345     Visit Number 3    Number of Visits 9    Date for OT Re-Evaluation 04/22/22    Authorization Type [employer], no copay?    Authorization Time Period No visit limit?    OT Start Time 1250    OT Stop Time 1335    OT Time Calculation (min) 45 min    Activity Tolerance Patient tolerated treatment well    Behavior During Therapy WFL for tasks assessed/performed             Past Medical History:  Diagnosis Date   Allergy    Past Surgical History:  Procedure Laterality Date   ABDOMINAL HYSTERECTOMY     COLONOSCOPY WITH PROPOFOL N/A 04/16/2019   Procedure: COLONOSCOPY WITH PROPOFOL;  Surgeon: Danie Binder, MD;  [facility];  Service: Endoscopy;  Laterality: N/A;  10:30am   Patient Active Problem List   Diagnosis Date Noted   Cervical radiculopathy 03/15/2022   Lumbar radiculopathy 08/05/2021   Acquired absence of uterus 05/11/2020   Body mass index (BMI) 24.0-24.9, adult 05/11/2020   Grief 05/11/2020   Left knee pain 05/11/2020   Tobacco use 05/11/2020   Trichomonal cervicitis 05/11/2020   Colon cancer screening 01/25/2019   Pelvic mass in female 03/13/2013    ONSET DATE: April 2023  REFERRING DIAG: L shoulder pain  THERAPY DIAG:  Acute pain of left shoulder  Stiffness of left shoulder, not elsewhere classified  Other symptoms and signs involving the musculoskeletal system  Rationale for Evaluation and Treatment: Rehabilitation  SUBJECTIVE:   SUBJECTIVE STATEMENT: "My pain seems to be a little better at rest."  PERTINENT HISTORY: Pt began having pain in her biceps and was unable to lift anything back in the Spring. This pain has worsened and travelled to her shoulder.    PRECAUTIONS: Shoulder  WEIGHT BEARING RESTRICTIONS: No  PAIN:  Are you having pain? Yes: NPRS scale: 5/10 Pain location: Biceps and forearms Pain description: achy and locks up Aggravating factors: movement and lifting Relieving factors: none  FALLS: Has patient fallen in last 6 months? No  PATIENT GOALS: To be able to use my arm and ride my motorcycle again   OBJECTIVE:   HAND DOMINANCE: Right  ADLs: Overall ADLs: Pt having increased difficulty getting dressed, bathing, grooming, difficulty pulling pants up, doing buttons and zippers, unable to use LUE to assist with any bathing.   FUNCTIONAL OUTCOME MEASURES: FOTO: 31.82  UPPER EXTREMITY ROM:     Active ROM Left eval  Shoulder flexion 85  Shoulder abduction 73  Shoulder internal rotation 55  Shoulder external rotation 50  Elbow flexion 71  Elbow extension 0  (Blank rows = not tested) UPPER EXTREMITY MMT:     MMT Left eval  Shoulder flexion   Shoulder abduction   Shoulder adduction   Shoulder extension   Shoulder internal rotation   Shoulder external rotation   Elbow flexion   Elbow extension   (Blank rows = not tested)  SENSATION: Pt reporting tingling throughout the medial aspect of LUE  EDEMA: None noted this session  OBSERVATIONS: Pt has severe fascial restrictions of the biceps and deltoid   TODAY'S TREATMENT:  DATE:  03/30/22 -Manual Therapy: myofascial release and trigger point applied to the biceps, axillary region, trapezius, and scapular region -P/ROM: supine, flexion, abduction, horizontal abduction, er/IR, 1x10 -AA/ROM: supine, flexion, abduction, protraction, horizontal abduction, er/IR, 1x10 -A/ROM: supine, flexion, abduction, protraction, horizontal abduction, er/IR, 1x10 -Thumbtacks: 10x10" -Wall Slides: flexion and abduction, 1x10 -Ball on the wall:  ABC's -Scapula ROM: extension, retraction, rows, 1x10  03/28/22 -Manual Therapy: myofascial release and trigger point applied to the biceps, axillary region, trapezius, and scapular region -P/ROM: supine, flexion, abduction, horizontal abduction, er/IR, 1x10 -AA/ROM: supine, flexion, abduction, protraction, horizontal abduction, er/IR, 1x10 -Wall Slides: flexion and abduction, 1x10 -Isometrics: flexion, extension, abduction, adduction, er, IR, 5x8"   PATIENT EDUCATION: Education details: AA/ROM and A/ROM Person educated: Patient Education method: Customer service manager Education comprehension: verbalized understanding and returned demonstration  HOME EXERCISE PROGRAM: 11/15: Pendulums 11/20: Wall Slides 11/22: AA/ROM and A/ROM  GOALS: Goals reviewed with patient? Yes  SHORT TERM GOALS: Target date:  04/22/22     Pt will be provided with and educated on HEP to improve mobility in the LUE required for ADL completion.  Goal status: IN PROGRESS  2.  Pt will decrease pain in LUE to 4/10 or less in order to sleep for 3+ consecutive hours without waking due to pain.  Goal status: IN PROGRESS  3.  Pt will decrease fascial restrictions to minimal amounts or less to improve mobility required for overhead reaching tasks.  Goal status: IN PROGRESS  4.  Pt will increase A/ROM of LUE to [facility] to improve ability to reach overhead and behind back during dressing and bathing tasks.  Goal status: IN PROGRESS  5.  Pt will improve strength in LUE to 4+/5 to improve ability to perform lifting tasks required during cooking and cleaning tasks.  Goal status: IN PROGRESS   ASSESSMENT:  CLINICAL IMPRESSION: Pt reporting that she has been having minimal relief with pain when not moving her arm. This session pain is increasing slowly with all exercises, however she reported that is was bearable pain. Session focusing on ROM, with stretching from wall slides, to encourage supported increasing  ROM. Therapist added endurance exercise of ball on the wall for 60 seconds at a time. Pt requiring verbal cuing for positioning and proper technique.    PLAN:  OT FREQUENCY: 2x/week  OT DURATION: 4 weeks  PLANNED INTERVENTIONS: self care/ADL training, therapeutic exercise, therapeutic activity, manual therapy, passive range of motion, functional mobility training, ultrasound, moist heat, cryotherapy, patient/family education, energy conservation, and DME and/or AE instructions  RECOMMENDED OTHER SERVICES: N/A  CONSULTED AND AGREED WITH PLAN OF CARE: Patient  PLAN FOR NEXT SESSION: Manual Therapy, P/ROM, AA/ROM, isometrics, wall slides, table slides   Paulita Fujita, OTR/L [facility] [place], [place] 03/31/2022, 6:16 PM

## 2022-03-31 ENCOUNTER — Encounter (HOSPITAL_COMMUNITY): Payer: Self-pay | Admitting: Occupational Therapy

## 2022-04-04 ENCOUNTER — Ambulatory Visit (HOSPITAL_COMMUNITY): Admitting: Occupational Therapy

## 2022-04-04 ENCOUNTER — Encounter (HOSPITAL_COMMUNITY): Payer: Self-pay | Admitting: Occupational Therapy

## 2022-04-04 DIAGNOSIS — M25612 Stiffness of left shoulder, not elsewhere classified: Secondary | ICD-10-CM

## 2022-04-04 DIAGNOSIS — M25512 Pain in left shoulder: Secondary | ICD-10-CM

## 2022-04-04 DIAGNOSIS — R29898 Other symptoms and signs involving the musculoskeletal system: Secondary | ICD-10-CM

## 2022-04-04 NOTE — Therapy (Signed)
OUTPATIENT OCCUPATIONAL THERAPY ORTHO TREATMENT NOTE  Patient Name: Kristen Lang MRN: [id number] DOB:08-03-1960, 61 y.o., female Today's Date: 04/04/2022  PCP: Danna Hefty, DO REFERRING PROVIDER: Larena Glassman, MD   OT End of Session - 04/04/22 1659     Visit Number 4    Number of Visits 9    Date for OT Re-Evaluation 04/22/22    Authorization Type [employer], no copay?    Authorization Time Period No visit limit?    OT Start Time 1346    OT Stop Time 1427    OT Time Calculation (min) 41 min    Activity Tolerance Patient tolerated treatment well    Behavior During Therapy WFL for tasks assessed/performed              Past Medical History:  Diagnosis Date   Allergy    Past Surgical History:  Procedure Laterality Date   ABDOMINAL HYSTERECTOMY     COLONOSCOPY WITH PROPOFOL N/A 04/16/2019   Procedure: COLONOSCOPY WITH PROPOFOL;  Surgeon: Danie Binder, MD;  Location: [facility];  Service: Endoscopy;  Laterality: N/A;  10:30am   Patient Active Problem List   Diagnosis Date Noted   Cervical radiculopathy 03/15/2022   Lumbar radiculopathy 08/05/2021   Acquired absence of uterus 05/11/2020   Body mass index (BMI) 24.0-24.9, adult 05/11/2020   Grief 05/11/2020   Left knee pain 05/11/2020   Tobacco use 05/11/2020   Trichomonal cervicitis 05/11/2020   Colon cancer screening 01/25/2019   Pelvic mass in female 03/13/2013    ONSET DATE: April 2023  REFERRING DIAG: L shoulder pain  THERAPY DIAG:  Acute pain of left shoulder  Stiffness of left shoulder, not elsewhere classified  Other symptoms and signs involving the musculoskeletal system  Rationale for Evaluation and Treatment: Rehabilitation  SUBJECTIVE:   SUBJECTIVE STATEMENT: "It's been hurting since I've been doing physical therapy."  PERTINENT HISTORY: Pt began having pain in her biceps and was unable to lift anything back in the Spring. This pain has worsened and travelled to her  shoulder.   PRECAUTIONS: Shoulder  WEIGHT BEARING RESTRICTIONS: No  PAIN:  Are you having pain? Yes: NPRS scale: 3/10 Pain location: Biceps and forearms Pain description: achy and locks up Aggravating factors: movement and lifting Relieving factors: none  FALLS: Has patient fallen in last 6 months? No  PATIENT GOALS: To be able to use my arm and ride my motorcycle again   OBJECTIVE:   HAND DOMINANCE: Right  ADLs: Overall ADLs: Pt having increased difficulty getting dressed, bathing, grooming, difficulty pulling pants up, doing buttons and zippers, unable to use LUE to assist with any bathing.   FUNCTIONAL OUTCOME MEASURES: FOTO: 31.82  UPPER EXTREMITY ROM:     Active ROM Left eval  Shoulder flexion 85  Shoulder abduction 73  Shoulder internal rotation 55  Shoulder external rotation 50  Elbow flexion 71  Elbow extension 0  (Blank rows = not tested) UPPER EXTREMITY MMT:     MMT Left eval  Shoulder flexion   Shoulder abduction   Shoulder adduction   Shoulder extension   Shoulder internal rotation   Shoulder external rotation   Elbow flexion   Elbow extension   (Blank rows = not tested)  SENSATION: Pt reporting tingling throughout the medial aspect of LUE  OBSERVATIONS: Pt has severe fascial restrictions of the biceps and deltoid   TODAY'S TREATMENT:  DATE:  04/04/22 -Manual Therapy: myofascial release and trigger point applied to the biceps, axillary region, trapezius, and scapular region -AA/ROM: seated- flexion, abduction, protraction, horizontal abduction, abduction 1x10 -A/ROM: Seated-protraction, er/IR, flexion, horizontal abduction, abduction, 1x10 -Abduction stretch at wall: 3x10" holds -ABC writing-LUE at 90 degrees flexion, 1 rest break at O and 1 rest break at x -Proximal shoulder strengthening: paddles, criss cross,  circles each direction, 10 reps each  -Isometrics: flexion, extension, abduction, adduction, 3x10"  03/30/22 -Manual Therapy: myofascial release and trigger point applied to the biceps, axillary region, trapezius, and scapular region -P/ROM: supine, flexion, abduction, horizontal abduction, er/IR, 1x10 -AA/ROM: supine, flexion, abduction, protraction, horizontal abduction, er/IR, 1x10 -A/ROM: supine, flexion, abduction, protraction, horizontal abduction, er/IR, 1x10 -Thumbtacks: 10x10" -Wall Slides: flexion and abduction, 1x10 -Ball on the wall: ABC's -Scapula ROM: extension, retraction, rows, 1x10  03/28/22 -Manual Therapy: myofascial release and trigger point applied to the biceps, axillary region, trapezius, and scapular region -P/ROM: supine, flexion, abduction, horizontal abduction, er/IR, 1x10 -AA/ROM: supine, flexion, abduction, protraction, horizontal abduction, er/IR, 1x10 -Wall Slides: flexion and abduction, 1x10 -Isometrics: flexion, extension, abduction, adduction, er, IR, 5x8"   PATIENT EDUCATION: Education details:  Person educated: Patient Education method: Customer service manager Education comprehension: verbalized understanding and returned demonstration  HOME EXERCISE PROGRAM: 11/15: Pendulums 11/20: Wall Slides 11/22: AA/ROM and A/ROM  GOALS: Goals reviewed with patient? Yes  SHORT TERM GOALS: Target date:  04/22/22     Pt will be provided with and educated on HEP to improve mobility in the LUE required for ADL completion.  Goal status: IN PROGRESS  2.  Pt will decrease pain in LUE to 4/10 or less in order to sleep for 3+ consecutive hours without waking due to pain.  Goal status: IN PROGRESS  3.  Pt will decrease fascial restrictions to minimal amounts or less to improve mobility required for overhead reaching tasks.  Goal status: IN PROGRESS  4.  Pt will increase A/ROM of LUE to [facility] to improve ability to reach overhead and behind back during  dressing and bathing tasks.  Goal status: IN PROGRESS  5.  Pt will improve strength in LUE to 4+/5 to improve ability to perform lifting tasks required during cooking and cleaning tasks.  Goal status: IN PROGRESS   ASSESSMENT:  CLINICAL IMPRESSION: Pt reporting  continued weakness throughout her LUE. Pt with mod fascial restrictions throughout the LUE, manual techniques completed to address. Pt with full P/ROM, discontinued passive stretching. Progressed to A/ROM today, added proximal shoulder strengthening and shoulder stretches. Pt reports she could feel the stretch down her side. Pt with fatigue during tasks, working on isometrics at end of session to address strength. Verbal cuing for form and technique throughout session.    PLAN:  OT FREQUENCY: 2x/week  OT DURATION: 4 weeks  PLANNED INTERVENTIONS: self care/ADL training, therapeutic exercise, therapeutic activity, manual therapy, passive range of motion, functional mobility training, ultrasound, moist heat, cryotherapy, patient/family education, energy conservation, and DME and/or AE instructions  RECOMMENDED OTHER SERVICES: N/A  CONSULTED AND AGREED WITH PLAN OF CARE: Patient  PLAN FOR NEXT SESSION: Manual Therapy, A/ROM, add scapular theraband   Guadelupe Sabin, OTR/L  [phone] 04/04/2022, 4:59 PM

## 2022-04-06 ENCOUNTER — Ambulatory Visit (HOSPITAL_COMMUNITY): Admitting: Occupational Therapy

## 2022-04-06 ENCOUNTER — Encounter (HOSPITAL_COMMUNITY): Payer: Self-pay | Admitting: Occupational Therapy

## 2022-04-06 DIAGNOSIS — M25612 Stiffness of left shoulder, not elsewhere classified: Secondary | ICD-10-CM

## 2022-04-06 DIAGNOSIS — R29898 Other symptoms and signs involving the musculoskeletal system: Secondary | ICD-10-CM

## 2022-04-06 DIAGNOSIS — M25512 Pain in left shoulder: Secondary | ICD-10-CM

## 2022-04-06 NOTE — Patient Instructions (Signed)

## 2022-04-06 NOTE — Therapy (Signed)
OUTPATIENT OCCUPATIONAL THERAPY ORTHO TREATMENT NOTE  Patient Name: Kristen Lang MRN: [id number] DOB:Dec 13, 1960, 61 y.o., female Today's Date: 04/06/2022  PCP: Danna Hefty, DO REFERRING PROVIDER: Larena Glassman, MD   OT End of Session - 04/06/22 0905     Visit Number 5    Number of Visits 9    Date for OT Re-Evaluation 04/22/22    Authorization Type [employer], no copay?    Authorization Time Period No visit limit?    OT Start Time 0902    OT Stop Time 0945    OT Time Calculation (min) 43 min    Activity Tolerance Patient tolerated treatment well    Behavior During Therapy WFL for tasks assessed/performed              Past Medical History:  Diagnosis Date   Allergy    Past Surgical History:  Procedure Laterality Date   ABDOMINAL HYSTERECTOMY     COLONOSCOPY WITH PROPOFOL N/A 04/16/2019   Procedure: COLONOSCOPY WITH PROPOFOL;  Surgeon: Danie Binder, MD;  Location: [facility];  Service: Endoscopy;  Laterality: N/A;  10:30am   Patient Active Problem List   Diagnosis Date Noted   Cervical radiculopathy 03/15/2022   Lumbar radiculopathy 08/05/2021   Acquired absence of uterus 05/11/2020   Body mass index (BMI) 24.0-24.9, adult 05/11/2020   Grief 05/11/2020   Left knee pain 05/11/2020   Tobacco use 05/11/2020   Trichomonal cervicitis 05/11/2020   Colon cancer screening 01/25/2019   Pelvic mass in female 03/13/2013    ONSET DATE: April 2023  REFERRING DIAG: L shoulder pain  THERAPY DIAG:  Acute pain of left shoulder  Stiffness of left shoulder, not elsewhere classified  Other symptoms and signs involving the musculoskeletal system  Rationale for Evaluation and Treatment: Rehabilitation  SUBJECTIVE:   SUBJECTIVE STATEMENT: "I'm having a nagging pain"  PERTINENT HISTORY: Pt began having pain in her biceps and was unable to lift anything back in the [place]. This pain has worsened and travelled to her shoulder.   PRECAUTIONS:  Shoulder  WEIGHT BEARING RESTRICTIONS: No  PAIN:  Are you having pain? Yes: NPRS scale: 3/10 Pain location: Biceps and forearms Pain description: achy and locks up Aggravating factors: movement and lifting Relieving factors: none  FALLS: Has patient fallen in last 6 months? No  PATIENT GOALS: To be able to use my arm and ride my motorcycle again   OBJECTIVE:   HAND DOMINANCE: Right  ADLs: Overall ADLs: Pt having increased difficulty getting dressed, bathing, grooming, difficulty pulling pants up, doing buttons and zippers, unable to use LUE to assist with any bathing.   FUNCTIONAL OUTCOME MEASURES: FOTO: 31.82  UPPER EXTREMITY ROM:     Active ROM Left eval  Shoulder flexion 85  Shoulder abduction 73  Shoulder internal rotation 55  Shoulder external rotation 50  Elbow flexion 71  Elbow extension 0  (Blank rows = not tested) UPPER EXTREMITY MMT:     MMT Left eval  Shoulder flexion   Shoulder abduction   Shoulder adduction   Shoulder extension   Shoulder internal rotation   Shoulder external rotation   Elbow flexion   Elbow extension   (Blank rows = not tested)  SENSATION: Pt reporting tingling throughout the medial aspect of LUE  OBSERVATIONS: Pt has severe fascial restrictions of the biceps and deltoid   TODAY'S TREATMENT:  DATE:  04/06/22 -Manual Therapy: myofascial release and trigger point applied to the biceps, axillary region, trapezius, and scapular region -AA/ROM: seated- 2lb dowel, flexion, abduction, protraction, horizontal abduction, er/IR 1x10 -A/ROM: Seated-protraction, er/IR, flexion, horizontal abduction, abduction, 1x10 -Scapula Strengthening: red theraband, extension, retraction, rows, 1x10 -Wall Slides: flexion and abduction, 1x10 -Ball on the wall: ABC's -Isometrics: flexion, extension, abduction, adduction,  5x10"  04/04/22 -Manual Therapy: myofascial release and trigger point applied to the biceps, axillary region, trapezius, and scapular region -AA/ROM: seated- flexion, abduction, protraction, horizontal abduction, abduction 1x10 -A/ROM: Seated-protraction, er/IR, flexion, horizontal abduction, abduction, 1x10 -Abduction stretch at wall: 3x10" holds -ABC writing-LUE at 90 degrees flexion, 1 rest break at O and 1 rest break at x -Proximal shoulder strengthening: paddles, criss cross, circles each direction, 10 reps each  -Isometrics: flexion, extension, abduction, adduction, 3x10"  03/30/22 -Manual Therapy: myofascial release and trigger point applied to the biceps, axillary region, trapezius, and scapular region -P/ROM: supine, flexion, abduction, horizontal abduction, er/IR, 1x10 -AA/ROM: supine, flexion, abduction, protraction, horizontal abduction, er/IR, 1x10 -A/ROM: supine, flexion, abduction, protraction, horizontal abduction, er/IR, 1x10 -Thumbtacks: 10x10" -Wall Slides: flexion and abduction, 1x10 -Ball on the wall: ABC's -Scapula ROM: extension, retraction, rows, 1x10  PATIENT EDUCATION: Education details: Physiological scientist Person educated: Patient Education method: Explanation and Demonstration Education comprehension: verbalized understanding and returned demonstration  HOME EXERCISE PROGRAM: 11/15: Pendulums 11/20: Wall Slides 11/22: AA/ROM and A/ROM 11/27: Isometrics 11/29: Scapula Strengthening - red theraband  GOALS: Goals reviewed with patient? Yes  SHORT TERM GOALS: Target date:  04/22/22     Pt will be provided with and educated on HEP to improve mobility in the LUE required for ADL completion.  Goal status: IN PROGRESS  2.  Pt will decrease pain in LUE to 4/10 or less in order to sleep for 3+ consecutive hours without waking due to pain.  Goal status: IN PROGRESS  3.  Pt will decrease fascial restrictions to minimal amounts or less to improve mobility  required for overhead reaching tasks.  Goal status: IN PROGRESS  4.  Pt will increase A/ROM of LUE to [facility] to improve ability to reach overhead and behind back during dressing and bathing tasks.  Goal status: IN PROGRESS  5.  Pt will improve strength in LUE to 4+/5 to improve ability to perform lifting tasks required during cooking and cleaning tasks.  Goal status: IN PROGRESS   ASSESSMENT:  CLINICAL IMPRESSION: Pt presents to therapy this session with minimal pain in the anterior aspect of her shoulder girdle. This session she focused on ROM and strengthening scapula. She demonstrates weakness on the L side with difficulty using the Red theraband during retractions and rows. Therapist providing verbal and tactile cuing for proper positioning and technique during all exercise tasks. Pt reporting no increase in pain this session, as well as good endurance, requiring only 1 rest break this session.    PLAN:  OT FREQUENCY: 2x/week  OT DURATION: 4 weeks  PLANNED INTERVENTIONS: self care/ADL training, therapeutic exercise, therapeutic activity, manual therapy, passive range of motion, functional mobility training, ultrasound, moist heat, cryotherapy, patient/family education, energy conservation, and DME and/or AE instructions  RECOMMENDED OTHER SERVICES: N/A  CONSULTED AND AGREED WITH PLAN OF CARE: Patient  PLAN FOR NEXT SESSION: Manual Therapy, A/ROM, scapular theraband, add PNF strengthening   Paulita Fujita, OTR/L [phone] 04/06/2022, 9:07 AM

## 2022-04-07 ENCOUNTER — Ambulatory Visit
Admission: RE | Admit: 2022-04-07 | Discharge: 2022-04-07 | Disposition: A | Discharge status: Home / Self Care | Source: Ambulatory Visit | Attending: Orthopedic Surgery | Admitting: Orthopedic Surgery

## 2022-04-07 DIAGNOSIS — M5412 Radiculopathy, cervical region: Secondary | ICD-10-CM

## 2022-04-07 DIAGNOSIS — M25512 Pain in left shoulder: Secondary | ICD-10-CM

## 2022-04-18 ENCOUNTER — Ambulatory Visit (HOSPITAL_COMMUNITY): Attending: Family Medicine | Admitting: Occupational Therapy

## 2022-04-18 ENCOUNTER — Encounter (HOSPITAL_COMMUNITY): Payer: Self-pay | Admitting: Occupational Therapy

## 2022-04-18 DIAGNOSIS — M25512 Pain in left shoulder: Secondary | ICD-10-CM | POA: Insufficient documentation

## 2022-04-18 DIAGNOSIS — R29898 Other symptoms and signs involving the musculoskeletal system: Secondary | ICD-10-CM | POA: Insufficient documentation

## 2022-04-18 DIAGNOSIS — M25612 Stiffness of left shoulder, not elsewhere classified: Secondary | ICD-10-CM | POA: Insufficient documentation

## 2022-04-18 NOTE — Patient Instructions (Signed)
1) Strengthening: Chest Pull - Resisted   Hold Theraband in front of body with hands about shoulder width a part. Pull band a part and back together slowly. Repeat ____ times. Complete ____ set(s) per session.. Repeat ____ session(s) per day.  http://orth.exer.us/926   Copyright  [employer]HI. All rights reserved.   2) PNF Strengthening: Resisted   Standing with resistive band around each hand, bring right arm up and away, thumb back. Repeat ____ times per set. Do ____ sets per session. Do ____ sessions per day.                           3) Resisted External Rotation: in Neutral - Bilateral   Sit or stand, tubing in both hands, elbows at sides, bent to 90, forearms forward. Pinch shoulder blades together and rotate forearms out. Keep elbows at sides. Repeat ____ times per set. Do ____ sets per session. Do ____ sessions per day.  http://orth.exer.us/966   Copyright  VHI. All rights reserved.   4) PNF Strengthening: Resisted   Standing, hold resistive band above head. Bring right arm down and out from side. Repeat ____ times per set. Do ____ sets per session. Do ____ sessions per day.  http://orth.exer.us/922   Copyright  VHI. All rights reserved.

## 2022-04-18 NOTE — Therapy (Signed)
OUTPATIENT OCCUPATIONAL THERAPY ORTHO TREATMENT NOTE  Patient Name: Kristen Lang MRN: [id number] DOB:June 01, 1960, 61 y.o., female Today's Date: 04/18/2022  PCP: Danna Hefty, DO REFERRING PROVIDER: Larena Glassman, MD   OT End of Session - 04/18/22 1034     Visit Number 6    Number of Visits 9    Date for OT Re-Evaluation 04/22/22    Authorization Type [employer], no copay?    Authorization Time Period No visit limit?    OT Start Time 1032    OT Stop Time 1115    OT Time Calculation (min) 43 min    Activity Tolerance Patient tolerated treatment well    Behavior During Therapy WFL for tasks assessed/performed              Past Medical History:  Diagnosis Date   Allergy    Past Surgical History:  Procedure Laterality Date   ABDOMINAL HYSTERECTOMY     COLONOSCOPY WITH PROPOFOL N/A 04/16/2019   Procedure: COLONOSCOPY WITH PROPOFOL;  Surgeon: Danie Binder, MD;  Location: [facility];  Service: Endoscopy;  Laterality: N/A;  10:30am   Patient Active Problem List   Diagnosis Date Noted   Cervical radiculopathy 03/15/2022   Lumbar radiculopathy 08/05/2021   Acquired absence of uterus 05/11/2020   Body mass index (BMI) 24.0-24.9, adult 05/11/2020   Grief 05/11/2020   Left knee pain 05/11/2020   Tobacco use 05/11/2020   Trichomonal cervicitis 05/11/2020   Colon cancer screening 01/25/2019   Pelvic mass in female 03/13/2013    ONSET DATE: April 2023  REFERRING DIAG: L shoulder pain  THERAPY DIAG:  Acute pain of left shoulder  Stiffness of left shoulder, not elsewhere classified  Other symptoms and signs involving the musculoskeletal system  Rationale for Evaluation and Treatment: Rehabilitation  SUBJECTIVE:   SUBJECTIVE STATEMENT: "I've had an MRI but I don't know what it says yet"  PERTINENT HISTORY: Pt began having pain in her biceps and was unable to lift anything back in the [place]. This pain has worsened and travelled to her shoulder.    PRECAUTIONS: Shoulder  WEIGHT BEARING RESTRICTIONS: No  PAIN:  Are you having pain? No  FALLS: Has patient fallen in last 6 months? No  PATIENT GOALS: To be able to use my arm and ride my motorcycle again   OBJECTIVE:   HAND DOMINANCE: Right  ADLs: Overall ADLs: Pt having increased difficulty getting dressed, bathing, grooming, difficulty pulling pants up, doing buttons and zippers, unable to use LUE to assist with any bathing.   FUNCTIONAL OUTCOME MEASURES: FOTO: 31.82  UPPER EXTREMITY ROM:     Active ROM Left eval  Shoulder flexion 85  Shoulder abduction 73  Shoulder internal rotation 55  Shoulder external rotation 50  Elbow flexion 71  Elbow extension 0  (Blank rows = not tested) UPPER EXTREMITY MMT:     MMT Left eval  Shoulder flexion   Shoulder abduction   Shoulder adduction   Shoulder extension   Shoulder internal rotation   Shoulder external rotation   Elbow flexion   Elbow extension   (Blank rows = not tested)  SENSATION: Pt reporting tingling throughout the medial aspect of LUE  OBSERVATIONS: Pt has severe fascial restrictions of the biceps and deltoid   TODAY'S TREATMENT:  DATE:  04/18/22 -Manual Therapy: myofascial release and trigger point applied to the biceps, axillary region, trapezius, and scapular region -AA/ROM: seated- 2lb dowel, flexion, abduction, protraction, horizontal abduction, er/IR 1x15 -A/ROM: Seated-protraction, er/IR, flexion, horizontal abduction, abduction, 1x10 -Scapula Strengthening: red theraband, extension, retraction, rows, 1x15 -PNF Strengthening: red theraband, chest pulls, er pulls, PNF up, PNF down, 1x10  04/06/22 -Manual Therapy: myofascial release and trigger point applied to the biceps, axillary region, trapezius, and scapular region -AA/ROM: seated- 2lb dowel, flexion, abduction,  protraction, horizontal abduction, er/IR 1x10 -A/ROM: Seated-protraction, er/IR, flexion, horizontal abduction, abduction, 1x10 -Scapula Strengthening: red theraband, extension, retraction, rows, 1x10 -Wall Slides: flexion and abduction, 1x10 -Ball on the wall: ABC's -Isometrics: flexion, extension, abduction, adduction, 5x10"  04/04/22 -Manual Therapy: myofascial release and trigger point applied to the biceps, axillary region, trapezius, and scapular region -AA/ROM: seated- flexion, abduction, protraction, horizontal abduction, abduction 1x10 -A/ROM: Seated-protraction, er/IR, flexion, horizontal abduction, abduction, 1x10 -Abduction stretch at wall: 3x10" holds -ABC writing-LUE at 90 degrees flexion, 1 rest break at O and 1 rest break at x -Proximal shoulder strengthening: paddles, criss cross, circles each direction, 10 reps each  -Isometrics: flexion, extension, abduction, adduction, 3x10"   PATIENT EDUCATION: Education details: PNF Strengthening Person educated: Patient Education method: Explanation and Demonstration Education comprehension: verbalized understanding and returned demonstration  HOME EXERCISE PROGRAM: 11/15: Pendulums 11/20: Wall Slides 11/22: AA/ROM and A/ROM 11/27: Isometrics 11/29: Scapula Strengthening - red theraband 12/11: PNF Strengthening - red theraband  GOALS: Goals reviewed with patient? Yes  SHORT TERM GOALS: Target date:  04/22/22     Pt will be provided with and educated on HEP to improve mobility in the LUE required for ADL completion.  Goal status: IN PROGRESS  2.  Pt will decrease pain in LUE to 4/10 or less in order to sleep for 3+ consecutive hours without waking due to pain.  Goal status: IN PROGRESS  3.  Pt will decrease fascial restrictions to minimal amounts or less to improve mobility required for overhead reaching tasks.  Goal status: IN PROGRESS  4.  Pt will increase A/ROM of LUE to [facility] to improve ability to reach overhead  and behind back during dressing and bathing tasks.  Goal status: IN PROGRESS  5.  Pt will improve strength in LUE to 4+/5 to improve ability to perform lifting tasks required during cooking and cleaning tasks.  Goal status: IN PROGRESS   ASSESSMENT:  CLINICAL IMPRESSION: Pt presented to therapy reporting no pain at rest and tingling down in her fingers on the R side. With ROM this session, pt having increased muscle fatigue and pain this session during abduction and horizontal abduction exercises, mainly along the anterior shoulder girdle. Therapist adding PNF strengthening this session, along with scapula strengthening, to improve muscle strength for shoulder supporting muscles. She was having difficulty with red theraband this session due to muscle fatigue, requiring frequent short rest breaks. Therapist providing verbal and tactile cuing for positioning and technique.    PLAN:  OT FREQUENCY: 2x/week  OT DURATION: 4 weeks  PLANNED INTERVENTIONS: self care/ADL training, therapeutic exercise, therapeutic activity, manual therapy, passive range of motion, functional mobility training, ultrasound, moist heat, cryotherapy, patient/family education, energy conservation, and DME and/or AE instructions  RECOMMENDED OTHER SERVICES: N/A  CONSULTED AND AGREED WITH PLAN OF CARE: Patient  PLAN FOR NEXT SESSION: Manual Therapy, A/ROM, scapular theraband, PNF strengthening, proximal shoulder strengthening   Paulita Fujita, OTR/L [phone] 04/18/2022, 10:35 AM

## 2022-04-19 ENCOUNTER — Encounter: Payer: Self-pay | Admitting: Orthopedic Surgery

## 2022-04-19 ENCOUNTER — Ambulatory Visit: Admitting: Orthopedic Surgery

## 2022-04-19 DIAGNOSIS — M5412 Radiculopathy, cervical region: Secondary | ICD-10-CM | POA: Diagnosis not present

## 2022-04-19 NOTE — Progress Notes (Signed)
Return patient Visit  Assessment: Kristen Lang is a 61 y.o. RHD female with the following: 1. Left shoulder pain, unspecified chronicity 2. Cervical radiculopathy  Plan: [name] has pain in her left arm.  She has radiating pain into her left hand, with some associated numbness and tingling.  She also has some atrophy and weakness in her left arm.  MRI was obtained, which demonstrates severe stenosis at multiple levels, and with possible myelomalacia.  I think these findings can explain a lot of her symptoms, including the strength and atrophy in the left arm.  I would like her to be evaluated by spine specialist, and we will place a referral to see Dr. Laurance Flatten in [place].  If she has issues scheduling appointment, she should contact the clinic.  I think is reasonable for her to stop working with physical therapy until she has been evaluated by Dr. Laurance Flatten.  Follow-up: Return for referral to Dr Laurance Flatten.  Subjective:  Chief Complaint  Patient presents with   Follow-up    Neck pain     History of Present Illness: Kristen Lang is a 61 y.o. female who returns for evaluation of left arm pain.  She continues to have atrophy, weakness and pain radiating into the left arm.  She also has some numbness and tingling throughout the left hand.  She has obtained an MRI, and is here to discuss the results today.  Review of Systems: No fevers or chills + numbness & tingling No chest pain No shortness of breath No bowel or bladder dysfunction No GI distress No headaches     Objective: There were no vitals taken for this visit.  Physical Exam:  General: Alert and oriented. and No acute distress. Gait: Normal gait.  Evaluation left shoulder demonstrates atrophy.  She has slightly restricted range of motion of the left shoulder compared to the right.  Restricted cervical spine range of motion in all directions.  No pain in the neck.  4/5 strength in the deltoid, supraspinatus  and infraspinatus.     IMAGING: I personally ordered and reviewed the following images  Cervical spine MRI  Impression  1. Moderate to severe central canal stenosis at C4-5 with T2 hyperintensity in the lateral aspect of the cord bilaterally likely reflecting chronic myelomalacia. 2. Severe foraminal stenosis bilaterally at C4-5 is worse on the right. 3. Severe right and moderate left foraminal stenosis at C3-4. 4. Severe left and moderate right foraminal stenosis at C5-6 and C6-7. 5. Mild central canal narrowing at C3-4, C5-6, and C6-7.  New Medications:  No orders of the defined types were placed in this encounter.     Mordecai Rasmussen, MD  04/19/2022 2:50 PM

## 2022-04-19 NOTE — Patient Instructions (Addendum)
Please call the office if you have not heard about your referral within a couple of days  We are referring you to [facility] from [facility] address is [place] The phone number is [phone]  The office will call you with an appointment Dr. Laurance Flatten

## 2022-04-20 ENCOUNTER — Ambulatory Visit (HOSPITAL_COMMUNITY): Admitting: Occupational Therapy

## 2022-04-20 DIAGNOSIS — M25512 Pain in left shoulder: Secondary | ICD-10-CM

## 2022-04-20 DIAGNOSIS — R29898 Other symptoms and signs involving the musculoskeletal system: Secondary | ICD-10-CM

## 2022-04-20 DIAGNOSIS — M25612 Stiffness of left shoulder, not elsewhere classified: Secondary | ICD-10-CM

## 2022-04-20 NOTE — Therapy (Signed)
OUTPATIENT OCCUPATIONAL THERAPY ORTHO TREATMENT NOTE  Patient Name: Kristen Lang MRN: [id number] DOB:1961/04/18, 61 y.o., female Today's Date: 04/20/2022  PCP: Danna Hefty, DO REFERRING PROVIDER: Larena Glassman, MD  OCCUPATIONAL THERAPY DISCHARGE SUMMARY  Visits from Start of Care: 7  Current functional level related to goals / functional outcomes: Pt has met 1 out of 5 goals. She was provided a comprehensive HEP for improving shoulder ROM and strength.    Remaining deficits: Pt continues to have deficits in her ROM, Strength, pain, and fascial restrictions. She continues to be unable to lift her LUE to shoulder height, overall has 4/5 or less strength, has severe fascial restrictions, and pain on average is above 4/10 daily.    Education / Equipment: Pt was provided a comprehensive HEP, along with theraband's for progressing strength.    Plan: Pt unable to meet goals at this time. She is following up with a surgeon and Dr. Amedeo Kinsman requested hold on therapy at this time, per patient.      END OF SESSION:  OT End of Session - 04/20/22 0945     Visit Number 7    Number of Visits 9    Date for OT Re-Evaluation 04/22/22    Authorization Type [employer], no copay?    Authorization Time Period No visit limit?    OT Start Time 0900    OT Stop Time 0940    OT Time Calculation (min) 40 min    Activity Tolerance Patient tolerated treatment well    Behavior During Therapy WFL for tasks assessed/performed              Past Medical History:  Diagnosis Date   Allergy    Past Surgical History:  Procedure Laterality Date   ABDOMINAL HYSTERECTOMY     COLONOSCOPY WITH PROPOFOL N/A 04/16/2019   Procedure: COLONOSCOPY WITH PROPOFOL;  Surgeon: Danie Binder, MD;  Location: [facility];  Service: Endoscopy;  Laterality: N/A;  10:30am   Patient Active Problem List   Diagnosis Date Noted   Cervical radiculopathy 03/15/2022   Lumbar radiculopathy 08/05/2021    Acquired absence of uterus 05/11/2020   Body mass index (BMI) 24.0-24.9, adult 05/11/2020   Grief 05/11/2020   Left knee pain 05/11/2020   Tobacco use 05/11/2020   Trichomonal cervicitis 05/11/2020   Colon cancer screening 01/25/2019   Pelvic mass in female 03/13/2013    ONSET DATE: April 2023  REFERRING DIAG: L shoulder pain  THERAPY DIAG:  No diagnosis found.  Rationale for Evaluation and Treatment: Rehabilitation  SUBJECTIVE:   SUBJECTIVE STATEMENT: "Today is my last day of therapy, I'm getting referred to a surgeon."  PERTINENT HISTORY: Pt began having pain in her biceps and was unable to lift anything back in the Spring. This pain has worsened and travelled to her shoulder.   PRECAUTIONS: Shoulder  WEIGHT BEARING RESTRICTIONS: No  PAIN:  Are you having pain? Yes: NPRS scale: 5/10 Pain location: Anterior shoulder girdle Pain description: sharp and achy Aggravating factors: movement Relieving factors: rest  FALLS: Has patient fallen in last 6 months? No  PATIENT GOALS: To be able to use my arm and ride my motorcycle again   OBJECTIVE:   HAND DOMINANCE: Right  ADLs: Overall ADLs: Pt having increased difficulty getting dressed, bathing, grooming, difficulty pulling pants up, doing buttons and zippers, unable to use LUE to assist with any bathing.   FUNCTIONAL OUTCOME MEASURES: FOTO: 31.82 12/13: 50.88  UPPER EXTREMITY ROM:     Active  ROM Left eval Left 04/20/22  Shoulder flexion 85 86  Shoulder abduction 73 87  Shoulder internal rotation 55 90  Shoulder external rotation 50 65  Elbow flexion 71 72  Elbow extension 0 0  (Blank rows = not tested) UPPER EXTREMITY MMT:     MMT Left eval Left 04/20/22  Shoulder flexion  4-/5  Shoulder abduction  3/5  Shoulder adduction  4/5  Shoulder extension  4/5  Shoulder internal rotation  4/5  Shoulder external rotation  4+/5  Elbow flexion  4-/5  Elbow extension  4+/5  (Blank rows = not  tested)  SENSATION: Pt reporting tingling throughout the medial aspect of LUE 12/13: pt reports tingling sensations on the palmar aspect of D1, D4, and D5  OBSERVATIONS: Pt has severe fascial restrictions of the biceps and deltoid   TODAY'S TREATMENT:                                                                                                                              DATE:  04/20/22 -A/ROM: Seated-protraction, er/IR, flexion, horizontal abduction, abduction, 1x10 -Scapula Strengthening: red theraband, extension, retraction, rows, 1x15 -PNF Strengthening: red theraband, chest pulls, er pulls, PNF up, PNF down, 1x10 -ROM measurements for d/c  04/18/22 -Manual Therapy: myofascial release and trigger point applied to the biceps, axillary region, trapezius, and scapular region -AA/ROM: seated- 2lb dowel, flexion, abduction, protraction, horizontal abduction, er/IR 1x15 -A/ROM: Seated-protraction, er/IR, flexion, horizontal abduction, abduction, 1x10 -Scapula Strengthening: red theraband, extension, retraction, rows, 1x15 -PNF Strengthening: red theraband, chest pulls, er pulls, PNF up, PNF down, 1x10  04/06/22 -Manual Therapy: myofascial release and trigger point applied to the biceps, axillary region, trapezius, and scapular region -AA/ROM: seated- 2lb dowel, flexion, abduction, protraction, horizontal abduction, er/IR 1x10 -A/ROM: Seated-protraction, er/IR, flexion, horizontal abduction, abduction, 1x10 -Scapula Strengthening: red theraband, extension, retraction, rows, 1x10 -Wall Slides: flexion and abduction, 1x10 -Ball on the wall: ABC's -Isometrics: flexion, extension, abduction, adduction, 5x10"    PATIENT EDUCATION: Education details: Review HEP Person educated: Patient Education method: Explanation and Demonstration Education comprehension: verbalized understanding and returned demonstration  HOME EXERCISE PROGRAM: 11/15: Pendulums 11/20: Wall Slides 11/22:  AA/ROM and A/ROM 11/27: Isometrics 11/29: Scapula Strengthening - red theraband 12/11: PNF Strengthening - red theraband  GOALS: Goals reviewed with patient? Yes  SHORT TERM GOALS: Target date:  04/22/22     Pt will be provided with and educated on HEP to improve mobility in the LUE required for ADL completion.  Goal status: MET  2.  Pt will decrease pain in LUE to 4/10 or less in order to sleep for 3+ consecutive hours without waking due to pain.  Goal status: NOT MET  3.  Pt will decrease fascial restrictions to minimal amounts or less to improve mobility required for overhead reaching tasks.  Goal status: NOT MET  4.  Pt will increase A/ROM of LUE to [facility] to improve ability to reach overhead and behind back during  dressing and bathing tasks.  Goal status: NOT MET  5.  Pt will improve strength in LUE to 4+/5 to improve ability to perform lifting tasks required during cooking and cleaning tasks.  Goal status: NOT MET   ASSESSMENT:  CLINICAL IMPRESSION: Pt seen this session for reassessment and discharge due to being referred to a surgeon from increasing pain and severe stenosis noted in her cervical region. She continues to demonstrate limited ROM, as well as limited strength in her LUE. She is having increased pain with most movements, especially during dressing and bathing tasks. Her plan is to follow up with the surgeon on 12/15, then follow their plan regarding next steps. OT will discharge pt at this time.    PLAN:  OT FREQUENCY: Discharge.    Paulita Fujita, OTR/L [phone] 04/20/2022, 9:15 AM

## 2022-04-21 ENCOUNTER — Encounter (HOSPITAL_COMMUNITY): Payer: Self-pay | Admitting: Occupational Therapy

## 2022-04-22 ENCOUNTER — Ambulatory Visit (INDEPENDENT_AMBULATORY_CARE_PROVIDER_SITE_OTHER)

## 2022-04-22 ENCOUNTER — Ambulatory Visit (INDEPENDENT_AMBULATORY_CARE_PROVIDER_SITE_OTHER): Admitting: Orthopedic Surgery

## 2022-04-22 VITALS — BP 132/82 | HR 79 | Ht 67.0 in | Wt 150.0 lb

## 2022-04-22 DIAGNOSIS — M5412 Radiculopathy, cervical region: Secondary | ICD-10-CM | POA: Diagnosis not present

## 2022-04-22 NOTE — Progress Notes (Addendum)
Orthopedic Spine Surgery Office Note  Assessment: Patient is a 61 y.o. female with neck pain that radiates into the left arm.  Also has hand dexterity issues.  Has C4-5 disc herniation with T2 cord signal change at that level.  She also has foraminal stenosis at C4-5, C5-6 and C6-7 neck be contributing to her radiculopathy.  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   Plan: -I discussed that she has some symptoms consistent with myelopathy and her MRI shows T2 cord signal change which is also consistent with myelopathy.  I explained the natural history of myelopathy and due to that natural history, operative intervention is recommended.  I explained that with her current smoking that her risk from surgery would be elevated.  After informing her of that elevated risk, she wanted to wait before proceeding with surgery so that she can quit.  We did discuss a C4-7 ACDF as a treatment option for her myeloradiculopathy.  I went over the risks, benefits and alternatives with her and we will plan to go over them at her next visit as well. -In the meantime, she can continue with over-the-counter medications for pain relief -Patient should return to office in 6 weeks, repeat x-rays of cervical spine at next visit: none   Patient expressed understanding of the plan and all questions were answered to the patient's satisfaction.   ___________________________________________________________________________   History:  Patient is a 61 y.o. female who presents today for cervical spine.  Patient noted insidious onset of neck pain that radiates into her left arm starting in March 2023.  There is no trauma or injury that brought on the pain.  Feels pain is gotten progressively worse with time.  She also notes that her left biceps has gotten weaker with time.  She gets numbness and tingling down the dorsal aspect of her left forearm and into all of the fingers on the left upper extremity.  She states that she has had  issues with fine motor skills in her hands, but feels it is mostly due to the numbness and tingling in her left hand.  Has some decrease sensation in the right hand but not nearly as significant as the left.  No pain radiating into the right arm.  Feels pain on a daily basis and has not noted anything that particularly aggravates it or relieves it.   Weakness: Yes, feels her left biceps is weaker but has been getting progressively weaker.  No other weakness noted Difficulty with fine motor skills (e.g., buttoning shirts, handwriting): Yes Symptoms of imbalance: Denies Paresthesias and numbness: Yes, going into the left forearm on the dorsal aspect and into all the fingers on the left side.  No other numbness or paresthesias Bowel or bladder incontinence: Denies Saddle anesthesia: Denies  Treatments tried: Physical therapy, activity modification, meloxicam, Tylenol, tramadol  Review of systems: Denies fevers and chills, night sweats, unexplained weight loss, history of cancer.  Has had pain that wakes her at night  Past medical history: DVT  Allergies: diluadid  Past surgical history:  Hysterectomy  Social history: Reports use of nicotine product (smoking, vaping, patches, smokeless) - smokes cigarettes Alcohol use: yes, 5 drinks/week Denies recreational drug use  Physical Exam:  General: no acute distress, appears stated age Neurologic: alert, answering questions appropriately, following commands Respiratory: unlabored breathing on room air, symmetric chest rise Psychiatric: appropriate affect, normal cadence to speech   MSK (spine):  -Strength exam      Left  Right Grip strength  5/5  5/5 Interosseus   5/5   5/5 Wrist extension  5/5  5/5 Wrist flexion   4+/5  5/5 Elbow flexion   4/5  5/5 Deltoid    5/5  5/5  EHL    5/5  5/5 TA    5/5  5/5 GSC    5/5  5/5 Knee extension  5/5  5/5 Hip flexion   5/5  5/5  -Sensory exam    Sensation intact to light  touch in L3-S1 nerve distributions of bilateral lower extremities  Sensation intact to light touch in C5-T1 nerve distributions of bilateral upper extremities  -Brachioradialis DTR: 2/4 on the left, 2/4 on the right -Biceps DTR: 2/4 on the left, 2/4 on the right -Achilles DTR: 2/4 on the left, 2/4 on the right -Patellar tendon DTR: 3/4 on the left, 3/4 on the right  -Spurling: negative bilaterally -Hoffman sign: negative bilaterally -Clonus: no beats bilaterally -Interosseous wasting: none seen -Grip and release test: negative -Romberg: negative -Gait: negative -Imbalance with tandem gait: no  Left shoulder exam: no pain through range of motion, negative jobe test, negative hawkins, no weakness with external rotation with arm at side, negative belly press Right shoulder exam: no pain through range of motion  Tinel's at wrist: negative bilaterally Phalen's at wrist: negative bilaterally Durkan's: negative bilaterally  Tinel's at elbow: positive on the left, negative on the right  Imaging: XR of the cervical spine from 03/15/2022 and 04/22/2022 was independently reviewed and interpreted, showing disc height loss and anterior osteophyte formation at C4-5, C5-6, C6-7.  Neutral alignment. No spondylolisthesis seen on flexion/extension.   MRI of the cervical spine from 04/07/2022 was independently reviewed and interpreted, showing C4-5 disc bulge with central stenosis and T2 cord signal change.  There is a synechiae sign at that level as well.  Effacement of the ventral thecal sac at C4-5.  Bilateral foraminal stenosis at C5-6.  Bilateral foraminal stenosis at C6-7 which is worse on the left.  Right-sided foraminal stenosis at C3-4.   Patient name: Kristen Lang Patient MRN: [id number] Date of visit: 04/22/22

## 2022-04-25 ENCOUNTER — Inpatient Hospital Stay: Attending: Physician Assistant

## 2022-04-25 DIAGNOSIS — Z86718 Personal history of other venous thrombosis and embolism: Secondary | ICD-10-CM | POA: Insufficient documentation

## 2022-04-25 DIAGNOSIS — Z7901 Long term (current) use of anticoagulants: Secondary | ICD-10-CM | POA: Insufficient documentation

## 2022-04-25 DIAGNOSIS — I82441 Acute embolism and thrombosis of right tibial vein: Secondary | ICD-10-CM

## 2022-04-25 LAB — ANTITHROMBIN III: AntiThromb III Func: 110 % (ref 75–120)

## 2022-04-26 LAB — PROTEIN C, TOTAL: Protein C, Total: 108 % (ref 60–150)

## 2022-04-27 ENCOUNTER — Encounter (HOSPITAL_COMMUNITY): Admitting: Occupational Therapy

## 2022-04-27 LAB — LUPUS ANTICOAGULANT PANEL
DRVVT: 42 s (ref 0.0–47.0)
PTT Lupus Anticoagulant: 45.5 s — ABNORMAL HIGH (ref 0.0–43.5)

## 2022-04-27 LAB — PROTEIN S, TOTAL AND FREE
Protein S Ag, Free: 111 % (ref 61–136)
Protein S Ag, Total: 102 % (ref 60–150)

## 2022-04-27 LAB — HEXAGONAL PHASE PHOSPHOLIPID: Hexagonal Phase Phospholipid: 5 s (ref 0–11)

## 2022-04-27 LAB — PTT-LA MIX: PTT-LA Mix: 42.1 s — ABNORMAL HIGH (ref 0.0–40.5)

## 2022-05-09 NOTE — Progress Notes (Signed)
Virtual Visit via Telephone Note [facility]  I connected with Kristen Lang  on 05/10/2022 at 4:00 PM by telephone and verified that I am speaking with the correct person using two identifiers.  Location: Patient: Home Provider: [facility]   I discussed the limitations, risks, security and privacy concerns of performing an evaluation and management service by telephone and the availability of in person appointments. I also discussed with the patient that there may be a patient responsible charge related to this service. The patient expressed understanding and agreed to proceed.  REASON FOR VISIT: Follow-up for right lower extremity DVT  PRIOR THERAPY: Eliquis  CURRENT THERAPY: Surveillance  INTERVAL HISTORY: Kristen Lang is contacted today for follow-up of her right lower extremity DVT.  She was last seen by Rojelio Brenner PA-C on 03/17/2022.  At today's visit, she reports feeling well.  She denies any signs or symptoms of recurrent DVT or PE after stopping Eliquis on 03/17/2022.  She denies any bleeding events while taking Eliquis.  She reports 100% energy and 100% appetite.  She reports that she is maintaining a stable weight at this time.    OBSERVATIONS/OBJECTIVE: Review of Systems  Constitutional:  Negative for chills, diaphoresis, fever, malaise/fatigue and weight loss.  Respiratory:  Negative for cough and shortness of breath.   Cardiovascular:  Negative for chest pain and palpitations.  Gastrointestinal:  Negative for abdominal pain, blood in stool, melena, nausea and vomiting.  Neurological:  Positive for tingling (left arm). Negative for dizziness and headaches.     PHYSICAL EXAM (per limitations of virtual telephone visit): The patient is alert and oriented x 3, exhibiting adequate mentation, good mood, and ability to speak in full sentences and execute sound judgement.   ASSESSMENT & PLAN: 1.  Right lower extremity DVT,  unprovoked - Referred by ED provider - ED visit on 08/17/2021 due to right leg pain.  Venous duplex ultrasound showed small occlusive DVT of anterior tibial vein of the right calf. - Patient completed 6 months of Eliquis treatment from 08/17/2021 through 03/17/2022. - No obvious provoking factors such as recent surgery, travel, prolonged immobility, hospitalization, hormone supplements, injury, or malignancy. - Her only questionable provoking factor is that she falls asleep in a seated position with her legs hanging down several times per week, sometimes will remain in that position for 4 to 6 hours. -No prior history of DVT, PE, or miscarriages.  No personal history of autoimmune or rheumatoid disorders.  No family history of VTE or miscarriages. - No B symptoms.  She is up-to-date on age-appropriate screenings including mammogram and colonoscopy. - Labs done by her PCP (08/19/2020) were negative for PT gene mutation, cardiolipin antibodies, and beta-2 glycoprotein antibodies. - Labs from 02/16/2022 show undetectable D-dimer (<0.27), factor V Leiden mutation NEGATIVE.  CBC unremarkable. - Right leg venous US (02/16/2022) is negative for DVT, with interval resolution of right sided tibial DVT seen on prior study. - Additional labs obtained 30 days after cessation of Eliquis showed negative lupus anticoagulant, normal protein C, normal protein S, normal Antithrombin III. - Even though she had distal DVT with very low thrombus burden, 6 months of anticoagulation was recommended by Dr. Ellin Saba as DVT was apparently unprovoked.  Per discussion with Dr. Ellin Saba at the time of patient's last visit on 03/17/2022, patient was discontinued from Eliquis since she had completed treatment x 6 months and repeat ultrasound showed resolution of DVT.  However, since that time, new data has  been published which shows 1-, 5-, and 10-year risk for VTE recurrence at 6%, 15%, and 27% respectively in patients with isolated  distal leg DVT (as reported by NEJM Journal Watch on 03/08/2022 and review of Ocie Doyne CT et al. Terese Door Haemost 2023 Oct).  Discussed with patient that she may benefit from low-dose anticoagulation (Eliquis 2.5 mg twice daily or Xarelto 10 mg daily) for secondary prophylaxis of DVT.  However, patient expresses strong preference to remain off of anticoagulation at this time. - PLAN: Follow-up as needed.  Discussed with patient that she does remain at increased risk of DVT/PE compared to the general population.  If she has any recurrent DVT or PE in the future she would warrant lifelong anticoagulation.  Counseled to avoid prolonged sitting.   2.  Other history - PMH: Seasonal allergies - SOCIAL: She works as the Haematologist.  She smokes 0.5 PPD since 2006 (8.5-pack-year history).  She drinks 1-2 beers daily.  She denies any illicit drug use. - FAMILY: She denies any family history of DVT, PE, or miscarriages.  She does report family history of malignancy - father had unspecified cancer, paternal uncle had bladder cancer, paternal grandfather had colon cancer.   PLAN SUMMARY >> Tentative discharge to PCP with follow-up as needed    I discussed the assessment and treatment plan with the patient. The patient was provided an opportunity to ask questions and all were answered. The patient agreed with the plan and demonstrated an understanding of the instructions.   The patient was advised to call back or seek an in-person evaluation if the symptoms worsen or if the condition fails to improve as anticipated.  I provided 15 minutes of non-face-to-face time during this encounter.   Carnella Guadalajara, PA-C 05/10/22 4:19 PM

## 2022-05-10 ENCOUNTER — Inpatient Hospital Stay: Attending: Physician Assistant | Admitting: Physician Assistant

## 2022-05-10 DIAGNOSIS — I82441 Acute embolism and thrombosis of right tibial vein: Secondary | ICD-10-CM | POA: Diagnosis not present

## 2022-06-03 ENCOUNTER — Ambulatory Visit (INDEPENDENT_AMBULATORY_CARE_PROVIDER_SITE_OTHER): Admitting: Orthopedic Surgery

## 2022-06-03 DIAGNOSIS — G549 Nerve root and plexus disorder, unspecified: Secondary | ICD-10-CM

## 2022-06-03 NOTE — Progress Notes (Signed)
Orthopedic Spine Surgery Office Note  Assessment: Patient is a 62 y.o. female with neck pain that radiates into her left arm. 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    Plan: -Patient has tried conservative treatments, but symptoms have persisted.  I discussed operative management again in the form of a C4-7 ACDF for her radicular and myelopathy symptoms.  She is still not interested in surgery because she is still smoking.  She wants to minimize her potential for complication with surgery.  She is still going to work on quitting smoking.  I told her that with myelopathy because of the functional decline over time, I do not make patients quit.  I told her that if she does experience any functional decline and hand dexterity, unsteadiness, bowel/bladder dysfunction, hand numbness that this is a sign that her myelopathy is progressing.  She wants to keep monitoring for now.  I told her if she does not experience any of those symptoms of progressive myelopathy, that she should come in sooner to reconsider surgery -Patient will next be seen at 6 weeks, x-rays at next visit: none  ___________________________________________________________________________  History:  Patient is a 62 y.o. female who has been previously seen in the office for symptoms consistent with myelopathy. Since the last visit, her symptoms have remained the same.  She does have difficulty with buttoning shirts but no other changes in her hand dexterity.  She has not had any issues with imbalance or unsteadiness.  She has had no changes in her bowel or bladder habits.  She does still have pain radiating down her left arm into the shoulder, lateral aspect of the arm, the radial aspect of the forearm, and into the thumb/index/long fingers.  No radiating pain on the right side.  She is still smoking.  Previous treatments: physical  therapy, activity modification, meloxicam, Tylenol, tramadol   COPY OF FIRST NOTE Patient is a 62 y.o. female who presents today for cervical spine.  Patient noted insidious onset of neck pain that radiates into her left arm starting in March 2023.  There is no trauma or injury that brought on the pain.  Feels pain is gotten progressively worse with time.  She also notes that her left biceps has gotten weaker with time.  She gets numbness and tingling down the dorsal aspect of her left forearm and into all of the fingers on the left upper extremity.  She states that she has had issues with fine motor skills in her hands, but feels it is mostly due to the numbness and tingling in her left hand.  Has some decrease sensation in the right hand but not nearly as significant as the left.  No pain radiating into the right arm.  Feels pain on a daily basis and has not noted anything that particularly aggravates it or relieves it.     Weakness: Yes, feels her left biceps is weaker but has been getting progressively weaker.  No other weakness noted Difficulty with fine motor skills (e.g., buttoning shirts, handwriting): Yes Symptoms of imbalance: Denies Paresthesias and numbness: Yes, going into the left forearm on the dorsal aspect and into all the fingers on the left side.  No other numbness or paresthesias Bowel or bladder incontinence: Denies Saddle anesthesia: Denies END OF COPY  Physical Exam:  General: no acute distress, appears stated age Neurologic: alert, answering questions appropriately, following commands Respiratory: unlabored breathing on room  air, symmetric chest rise Psychiatric: appropriate affect, normal cadence to speech   MSK (spine):  -Strength exam      Left  Right Grip strength               5/5  5/5 Interosseus   5/5   5/5 Wrist extension  5/5  5/5 Wrist flexion   5/5  5/5 Elbow flexion   4/5  5/5 Deltoid    5/5  5/5  EHL    5/5  5/5 TA    5/5  5/5 GSC     5/5  5/5 Knee extension  5/5  5/5 Hip flexion   5/5  5/5  -Sensory exam    Sensation intact to light touch in L3-S1 nerve distributions of bilateral lower extremities  Sensation intact to light touch in C5-T1 nerve distributions of bilateral upper extremities  -Brachioradialis DTR: 2/4 on the left, 2/4 on the right -Biceps DTR: 2/4 on the left, 2/4 on the right -Achilles DTR: 2/4 on the left, 2/4 on the right -Patellar tendon DTR: 3/4 on the left, 3/4 on the right   -Spurling: negative bilaterally -Hoffman sign: negative bilaterally -Clonus: no beats bilaterally -Interosseous wasting: none seen -Grip and release test: negative -Romberg: negative -Gait: negative -Imbalance with tandem gait: no   Left shoulder exam: no pain through range of motion, negative jobe test, negative hawkins, no weakness with external rotation with arm at side, negative belly press Right shoulder exam: no pain through range of motion  Tinel's at wrist: negative bilaterally Phalen's at wrist: negative bilaterally Durkan's: negative bilaterally   Tinel's at elbow: negative bilaterally  Imaging: XR of the cervical spine from 03/15/2022 and 04/22/2022 was previously independently reviewed and interpreted, showing disc height loss and anterior osteophyte formation at C4-5, C5-6, C6-7. Neutral alignment. No spondylolisthesis seen on flexion/extension.    MRI of the cervical spine from 04/07/2022 was previously independently reviewed and interpreted, showing C4/5 disc bulge with central stenosis and T2 cord signal change.  There is a snake eye sign at that level as well.  Effacement of the ventral thecal sac at C4/5.  Bilateral foraminal stenosis at C5/6.  Bilateral foraminal stenosis at C6/7 which is worse on the left.  Right-sided foraminal stenosis at C3/4.   Patient name: Kristen Lang Patient MRN: [id number] Date of visit: 06/03/22

## 2022-07-15 ENCOUNTER — Ambulatory Visit: Admitting: Orthopedic Surgery

## 2023-04-20 ENCOUNTER — Other Ambulatory Visit (HOSPITAL_COMMUNITY): Payer: Self-pay | Admitting: Family Medicine

## 2023-04-20 DIAGNOSIS — Z1231 Encounter for screening mammogram for malignant neoplasm of breast: Secondary | ICD-10-CM

## 2023-04-20 DIAGNOSIS — M79604 Pain in right leg: Secondary | ICD-10-CM

## 2023-05-01 ENCOUNTER — Ambulatory Visit (HOSPITAL_COMMUNITY)
Admission: RE | Admit: 2023-05-01 | Discharge: 2023-05-01 | Disposition: A | Discharge status: Home / Self Care | Source: Ambulatory Visit | Attending: Family Medicine | Admitting: Family Medicine

## 2023-05-01 DIAGNOSIS — M79604 Pain in right leg: Secondary | ICD-10-CM | POA: Insufficient documentation

## 2023-05-04 ENCOUNTER — Ambulatory Visit (HOSPITAL_COMMUNITY)
Admission: RE | Admit: 2023-05-04 | Discharge: 2023-05-04 | Disposition: A | Discharge status: Home / Self Care | Source: Ambulatory Visit | Attending: Family Medicine | Admitting: Family Medicine

## 2023-05-04 ENCOUNTER — Encounter (HOSPITAL_COMMUNITY): Payer: Self-pay

## 2023-05-04 DIAGNOSIS — Z1231 Encounter for screening mammogram for malignant neoplasm of breast: Secondary | ICD-10-CM | POA: Diagnosis present

## 2024-05-06 ENCOUNTER — Other Ambulatory Visit (HOSPITAL_COMMUNITY): Payer: Self-pay | Admitting: Family Medicine

## 2024-05-06 DIAGNOSIS — Z1231 Encounter for screening mammogram for malignant neoplasm of breast: Secondary | ICD-10-CM

## 2024-05-10 ENCOUNTER — Ambulatory Visit (HOSPITAL_COMMUNITY)
Admission: RE | Admit: 2024-05-10 | Discharge: 2024-05-10 | Disposition: A | Discharge status: Home / Self Care | Source: Ambulatory Visit | Attending: Family Medicine | Admitting: Family Medicine

## 2024-05-10 DIAGNOSIS — Z1231 Encounter for screening mammogram for malignant neoplasm of breast: Secondary | ICD-10-CM | POA: Diagnosis present
